# Patient Record
Sex: Male | Born: 1960 | Race: Black or African American | Hispanic: No | Marital: Single | Smoking: Current some day smoker
Health system: Southern US, Community
[De-identification: ages and names within clinical notes are randomized; demographics above are authoritative.]

## PROBLEM LIST (undated history)

## (undated) DIAGNOSIS — D869 Sarcoidosis, unspecified: Secondary | ICD-10-CM

## (undated) DIAGNOSIS — J45909 Unspecified asthma, uncomplicated: Secondary | ICD-10-CM

## (undated) DIAGNOSIS — J449 Chronic obstructive pulmonary disease, unspecified: Secondary | ICD-10-CM

---

## 2015-09-15 ENCOUNTER — Inpatient Hospital Stay
Admission: EM | Admit: 2015-09-15 | Discharge: 2015-09-17 | DRG: 871 | Disposition: A | Discharge status: Home / Self Care | Payer: Medicare Other | Attending: Internal Medicine | Admitting: Internal Medicine

## 2015-09-15 ENCOUNTER — Inpatient Hospital Stay: Payer: Medicare Other

## 2015-09-15 ENCOUNTER — Encounter: Payer: Self-pay | Admitting: Emergency Medicine

## 2015-09-15 ENCOUNTER — Emergency Department: Payer: Medicare Other

## 2015-09-15 DIAGNOSIS — J9600 Acute respiratory failure, unspecified whether with hypoxia or hypercapnia: Secondary | ICD-10-CM

## 2015-09-15 DIAGNOSIS — J45909 Unspecified asthma, uncomplicated: Secondary | ICD-10-CM | POA: Diagnosis present

## 2015-09-15 DIAGNOSIS — A419 Sepsis, unspecified organism: Secondary | ICD-10-CM | POA: Diagnosis not present

## 2015-09-15 DIAGNOSIS — J440 Chronic obstructive pulmonary disease with acute lower respiratory infection: Secondary | ICD-10-CM | POA: Diagnosis present

## 2015-09-15 DIAGNOSIS — F17210 Nicotine dependence, cigarettes, uncomplicated: Secondary | ICD-10-CM | POA: Diagnosis present

## 2015-09-15 DIAGNOSIS — J9601 Acute respiratory failure with hypoxia: Secondary | ICD-10-CM | POA: Diagnosis present

## 2015-09-15 DIAGNOSIS — D869 Sarcoidosis, unspecified: Secondary | ICD-10-CM | POA: Diagnosis present

## 2015-09-15 DIAGNOSIS — J9801 Acute bronchospasm: Secondary | ICD-10-CM | POA: Diagnosis not present

## 2015-09-15 DIAGNOSIS — Z716 Tobacco abuse counseling: Secondary | ICD-10-CM

## 2015-09-15 DIAGNOSIS — J189 Pneumonia, unspecified organism: Secondary | ICD-10-CM | POA: Diagnosis present

## 2015-09-15 DIAGNOSIS — J441 Chronic obstructive pulmonary disease with (acute) exacerbation: Secondary | ICD-10-CM

## 2015-09-15 DIAGNOSIS — Z8249 Family history of ischemic heart disease and other diseases of the circulatory system: Secondary | ICD-10-CM

## 2015-09-15 HISTORY — DX: Sarcoidosis, unspecified: D86.9

## 2015-09-15 HISTORY — DX: Unspecified asthma, uncomplicated: J45.909

## 2015-09-15 HISTORY — DX: Chronic obstructive pulmonary disease, unspecified: J44.9

## 2015-09-15 LAB — URINALYSIS COMPLETE WITH MICROSCOPIC (ARMC ONLY)
Bacteria, UA: NONE SEEN
Bilirubin Urine: NEGATIVE
GLUCOSE, UA: NEGATIVE mg/dL
HGB URINE DIPSTICK: NEGATIVE
Ketones, ur: NEGATIVE mg/dL
LEUKOCYTES UA: NEGATIVE
NITRITE: NEGATIVE
PH: 5 (ref 5.0–8.0)
Protein, ur: NEGATIVE mg/dL
RBC / HPF: NONE SEEN RBC/hpf (ref 0–5)
SPECIFIC GRAVITY, URINE: 1.005 (ref 1.005–1.030)
Squamous Epithelial / LPF: NONE SEEN
WBC UA: NONE SEEN WBC/hpf (ref 0–5)

## 2015-09-15 LAB — COMPREHENSIVE METABOLIC PANEL
ALBUMIN: 4.1 g/dL (ref 3.5–5.0)
ALT: 17 U/L (ref 17–63)
ANION GAP: 9 (ref 5–15)
AST: 31 U/L (ref 15–41)
Alkaline Phosphatase: 88 U/L (ref 38–126)
BUN: 10 mg/dL (ref 6–20)
CHLORIDE: 99 mmol/L — AB (ref 101–111)
CO2: 27 mmol/L (ref 22–32)
Calcium: 9 mg/dL (ref 8.9–10.3)
Creatinine, Ser: 1.2 mg/dL (ref 0.61–1.24)
Glucose, Bld: 109 mg/dL — ABNORMAL HIGH (ref 65–99)
POTASSIUM: 3.9 mmol/L (ref 3.5–5.1)
SODIUM: 135 mmol/L (ref 135–145)
Total Bilirubin: 0.3 mg/dL (ref 0.3–1.2)
Total Protein: 7.5 g/dL (ref 6.5–8.1)

## 2015-09-15 LAB — CBC WITH DIFFERENTIAL/PLATELET
BASOS ABS: 0.1 10*3/uL (ref 0–0.1)
Basophils Relative: 1 %
EOS PCT: 1 %
Eosinophils Absolute: 0.1 10*3/uL (ref 0–0.7)
HEMATOCRIT: 44.5 % (ref 40.0–52.0)
HEMOGLOBIN: 14.7 g/dL (ref 13.0–18.0)
LYMPHS ABS: 0.9 10*3/uL — AB (ref 1.0–3.6)
LYMPHS PCT: 8 %
MCH: 28.1 pg (ref 26.0–34.0)
MCHC: 32.9 g/dL (ref 32.0–36.0)
MCV: 85.4 fL (ref 80.0–100.0)
Monocytes Absolute: 1.2 10*3/uL — ABNORMAL HIGH (ref 0.2–1.0)
Monocytes Relative: 11 %
NEUTROS ABS: 8.5 10*3/uL — AB (ref 1.4–6.5)
NEUTROS PCT: 79 %
PLATELETS: 303 10*3/uL (ref 150–440)
RBC: 5.22 MIL/uL (ref 4.40–5.90)
RDW: 14.3 % (ref 11.5–14.5)
WBC: 10.7 10*3/uL — AB (ref 3.8–10.6)

## 2015-09-15 LAB — STREP PNEUMONIAE URINARY ANTIGEN: STREP PNEUMO URINARY ANTIGEN: NEGATIVE

## 2015-09-15 LAB — GLUCOSE, CAPILLARY
GLUCOSE-CAPILLARY: 127 mg/dL — AB (ref 65–99)
GLUCOSE-CAPILLARY: 149 mg/dL — AB (ref 65–99)
Glucose-Capillary: 145 mg/dL — ABNORMAL HIGH (ref 65–99)

## 2015-09-15 LAB — TROPONIN I: TROPONIN I: 0.03 ng/mL (ref <0.031)

## 2015-09-15 LAB — LACTIC ACID, PLASMA
Lactic Acid, Venous: 1.6 mmol/L (ref 0.5–2.0)
Lactic Acid, Venous: 2.1 mmol/L (ref 0.5–2.0)

## 2015-09-15 LAB — RAPID INFLUENZA A&B ANTIGENS: Influenza B (ARMC): NEGATIVE

## 2015-09-15 LAB — MRSA PCR SCREENING: MRSA by PCR: NEGATIVE

## 2015-09-15 LAB — PROTIME-INR
INR: 1.11
Prothrombin Time: 14.5 seconds (ref 11.4–15.0)

## 2015-09-15 LAB — RAPID INFLUENZA A&B ANTIGENS (ARMC ONLY): INFLUENZA A (ARMC): NEGATIVE

## 2015-09-15 MED ORDER — CEFTRIAXONE SODIUM 1 G IJ SOLR
1.0000 g | INTRAMUSCULAR | Status: DC
  Filled 2015-09-15: qty 10

## 2015-09-15 MED ORDER — PREDNISONE 50 MG PO TABS
50.0000 mg | ORAL_TABLET | Freq: Every day | ORAL | Status: DC
  Administered 2015-09-16 – 2015-09-17 (×2): 50 mg via ORAL
  Filled 2015-09-15 (×2): qty 1

## 2015-09-15 MED ORDER — HYDROCODONE-ACETAMINOPHEN 5-325 MG PO TABS
1.0000 | ORAL_TABLET | ORAL | Status: DC | PRN
  Administered 2015-09-16: 1 via ORAL
  Administered 2015-09-16: 2 via ORAL
  Administered 2015-09-16: 1 via ORAL
  Administered 2015-09-17 (×2): 2 via ORAL
  Filled 2015-09-15: qty 1
  Filled 2015-09-15: qty 2
  Filled 2015-09-15: qty 1
  Filled 2015-09-15 (×2): qty 2

## 2015-09-15 MED ORDER — ALBUTEROL SULFATE (2.5 MG/3ML) 0.083% IN NEBU
5.0000 mg | INHALATION_SOLUTION | RESPIRATORY_TRACT | Status: AC
  Administered 2015-09-15: 5 mg via RESPIRATORY_TRACT
  Filled 2015-09-15: qty 6

## 2015-09-15 MED ORDER — GUAIFENESIN 100 MG/5ML PO SOLN
10.0000 mL | ORAL | Status: DC | PRN
  Administered 2015-09-15 – 2015-09-17 (×7): 200 mg via ORAL
  Filled 2015-09-15 (×7): qty 10

## 2015-09-15 MED ORDER — ACETAMINOPHEN 500 MG PO TABS
1000.0000 mg | ORAL_TABLET | ORAL | Status: AC
  Administered 2015-09-15: 1000 mg via ORAL
  Filled 2015-09-15: qty 2

## 2015-09-15 MED ORDER — IPRATROPIUM-ALBUTEROL 0.5-2.5 (3) MG/3ML IN SOLN
3.0000 mL | Freq: Once | RESPIRATORY_TRACT | Status: AC
  Administered 2015-09-15: 3 mL via RESPIRATORY_TRACT
  Filled 2015-09-15: qty 3

## 2015-09-15 MED ORDER — VANCOMYCIN HCL IN DEXTROSE 1-5 GM/200ML-% IV SOLN
1000.0000 mg | Freq: Once | INTRAVENOUS | Status: AC
  Administered 2015-09-15: 1000 mg via INTRAVENOUS
  Filled 2015-09-15: qty 200

## 2015-09-15 MED ORDER — PIPERACILLIN-TAZOBACTAM 3.375 G IVPB 30 MIN
3.3750 g | Freq: Once | INTRAVENOUS | Status: AC
  Administered 2015-09-15: 3.375 g via INTRAVENOUS
  Filled 2015-09-15: qty 50

## 2015-09-15 MED ORDER — INSULIN ASPART 100 UNIT/ML ~~LOC~~ SOLN
0.0000 [IU] | SUBCUTANEOUS | Status: DC
  Administered 2015-09-15 – 2015-09-16 (×4): 1 [IU] via SUBCUTANEOUS
  Filled 2015-09-15 (×4): qty 1

## 2015-09-15 MED ORDER — ACETAMINOPHEN 650 MG RE SUPP: 650.0000 mg | Freq: Four times a day (QID) | RECTAL | Status: DC | PRN

## 2015-09-15 MED ORDER — DEXTROSE 5 % IV SOLN
500.0000 mg | INTRAVENOUS | Status: DC
  Administered 2015-09-15: 500 mg via INTRAVENOUS
  Filled 2015-09-15 (×2): qty 500

## 2015-09-15 MED ORDER — MORPHINE SULFATE (PF) 2 MG/ML IV SOLN
1.0000 mg | INTRAVENOUS | Status: DC | PRN
  Administered 2015-09-15 – 2015-09-16 (×5): 1 mg via INTRAVENOUS
  Filled 2015-09-15 (×5): qty 1

## 2015-09-15 MED ORDER — SODIUM CHLORIDE 0.9 % IV SOLN
INTRAVENOUS | Status: DC
  Administered 2015-09-15 – 2015-09-16 (×3): via INTRAVENOUS

## 2015-09-15 MED ORDER — ENOXAPARIN SODIUM 40 MG/0.4ML ~~LOC~~ SOLN
40.0000 mg | SUBCUTANEOUS | Status: DC
  Administered 2015-09-15 – 2015-09-16 (×2): 40 mg via SUBCUTANEOUS
  Filled 2015-09-15 (×2): qty 0.4

## 2015-09-15 MED ORDER — ACETAMINOPHEN 325 MG PO TABS
650.0000 mg | ORAL_TABLET | Freq: Four times a day (QID) | ORAL | Status: DC | PRN
  Administered 2015-09-15 – 2015-09-16 (×2): 650 mg via ORAL
  Filled 2015-09-15 (×2): qty 2
  Filled 2015-09-15: qty 1

## 2015-09-15 MED ORDER — ALBUTEROL SULFATE (2.5 MG/3ML) 0.083% IN NEBU
2.5000 mg | INHALATION_SOLUTION | Freq: Four times a day (QID) | RESPIRATORY_TRACT | Status: DC
  Administered 2015-09-15 – 2015-09-17 (×7): 2.5 mg via RESPIRATORY_TRACT
  Filled 2015-09-15 (×9): qty 3

## 2015-09-15 MED ORDER — SENNOSIDES-DOCUSATE SODIUM 8.6-50 MG PO TABS: 1.0000 | ORAL_TABLET | Freq: Every evening | ORAL | Status: DC | PRN

## 2015-09-15 MED ORDER — METHYLPREDNISOLONE SODIUM SUCC 125 MG IJ SOLR
125.0000 mg | INTRAMUSCULAR | Status: AC
  Administered 2015-09-15: 125 mg via INTRAVENOUS
  Filled 2015-09-15: qty 2

## 2015-09-15 MED ORDER — CETYLPYRIDINIUM CHLORIDE 0.05 % MT LIQD
7.0000 mL | Freq: Two times a day (BID) | OROMUCOSAL | Status: DC
  Administered 2015-09-15 – 2015-09-16 (×2): 7 mL via OROMUCOSAL

## 2015-09-15 MED ORDER — MORPHINE SULFATE (PF) 4 MG/ML IV SOLN
4.0000 mg | Freq: Once | INTRAVENOUS | Status: AC
  Administered 2015-09-15: 4 mg via INTRAVENOUS
  Filled 2015-09-15: qty 1

## 2015-09-15 MED ORDER — SODIUM CHLORIDE 0.9 % IV BOLUS (SEPSIS)
1973.0000 mL | Freq: Once | INTRAVENOUS | Status: AC
  Administered 2015-09-15: 1973 mL via INTRAVENOUS

## 2015-09-15 MED ORDER — TIOTROPIUM BROMIDE MONOHYDRATE 18 MCG IN CAPS
18.0000 ug | ORAL_CAPSULE | Freq: Every day | RESPIRATORY_TRACT | Status: DC
  Administered 2015-09-15: 18 ug via RESPIRATORY_TRACT
  Filled 2015-09-15 (×2): qty 5

## 2015-09-15 NOTE — ED Notes (Signed)
Pt presents to ED with c/o difficulty breathing, pain in his chest, and a fever. Pt states he has a hx of sarcoidosis, COPD, and asthma. Pt taken back to room and triaged in room.

## 2015-09-15 NOTE — Progress Notes (Signed)
RN spoke with Dr. Allena Katz about patient traveling to CT without RN. Patient is A&Ox4, denies pain and VSS.  MD gave order for patient to travel to CT scan without RN.

## 2015-09-15 NOTE — ED Provider Notes (Signed)
[facility] Emergency Department Provider Note  ____________________________________________  Time seen: Approximately 1:21 PM  I have reviewed the triage vital signs and the nursing notes.   HISTORY  Chief Complaint Code Sepsis    HPI Miguel Nunez is a 55 y.o. male who presents for evaluation of chest tightness fever and cough.  Patient reports that since last night is been experiencing difficulty breathing, wheezing, using nebulizers at home with no relief. He's also had a productive cough and feeling feverish. He feels tight across the entire chest. He does have a history of sarcoidosis.  He denies nausea or vomiting. No headache. Muscle aches. No runny nose.  Currently states moderate shortness of breath.  Otherwise reports only concern is arthritis she is required after many years, and chronic.   Past Medical History  Diagnosis Date  . Sarcoidosis (HCC)   . COPD (chronic obstructive pulmonary disease) (HCC)   . Asthma     Patient Active Problem List   Diagnosis Date Noted  . Sepsis (HCC) 09/15/2015    History reviewed. No pertinent past surgical history.  No current outpatient prescriptions on file.  Allergies Review of patient's allergies indicates no known allergies.  Family History  Problem Relation Age of Onset  . Hypertension Other     Social History Social History  Substance Use Topics  . Smoking status: Current Some Day Smoker  . Smokeless tobacco: Never Used  . Alcohol Use: Yes     Comment: Occassionally    Review of Systems Constitutional: See history of present illness Eyes: No visual changes. ENT: No sore throat. Cardiovascular: Feels tight for about the last 12 hours Respiratory: See history of present illness Gastrointestinal: No abdominal pain.  No nausea, no vomiting.  No diarrhea.  No constipation. Genitourinary: Negative for dysuria. Musculoskeletal: Negative for back pain. Skin: Negative for  rash. Neurological: Negative for headaches, focal weakness or numbness.  10-point ROS otherwise negative.  ____________________________________________   PHYSICAL EXAM:  VITAL SIGNS: ED Triage Vitals  Enc Vitals Group     BP 09/15/15 1153 128/67 mmHg     Pulse Rate 09/15/15 1153 150     Resp 09/15/15 1153 24     Temp 09/15/15 1153 103.2 F (39.6 C)     Temp Source 09/15/15 1153 Oral     SpO2 09/15/15 1153 92 %     Weight 09/15/15 1153 145 lb (65.772 kg)     Height 09/15/15 1153  (1.727 m)     Head Cir --      Peak Flow --      Pain Score 09/15/15 1153 8     Pain Loc --      Pain Edu? --      Excl. in GC? --    Constitutional: Alert and oriented. Moderately ill-appearing, mildly tachypnea. He is amicable and pleasant, however does appear to have mild to moderate increased work of breathing. Eyes: Conjunctivae are normal. PERRL. EOMI. Head: Atraumatic. Nose: No congestion/rhinnorhea. Mouth/Throat: Mucous membranes are quite dry.  Oropharynx non-erythematous. Neck: No stridor.  No meningismus Cardiovascular: Tachycardic rate, regular rhythm. Grossly normal heart sounds.  Good peripheral circulation. Respiratory: Prolonged expiratory phase, speaks in phrases. Mild end expiratory wheezing heard throughout. Gastrointestinal: Soft and nontender. No distention. No abdominal bruits. No CVA tenderness. Musculoskeletal: No lower extremity tenderness nor edema.  No joint effusions. Neurologic:  Normal speech and language. No gross focal neurologic deficits are appreciated.Skin:  Skin is warm, dry and intact. No rash noted.  Psychiatric: Mood and affect are normal. Speech and behavior are normal.  ____________________________________________   LABS (all labs ordered are listed, but only abnormal results are displayed)  Labs Reviewed  LACTIC ACID, PLASMA - Abnormal; Notable for the following:    Lactic Acid, Venous 2.1 (*)    All other components within normal limits   COMPREHENSIVE METABOLIC PANEL - Abnormal; Notable for the following:    Chloride 99 (*)    Glucose, Bld 109 (*)    All other components within normal limits  CBC WITH DIFFERENTIAL/PLATELET - Abnormal; Notable for the following:    WBC 10.7 (*)    Neutro Abs 8.5 (*)    Lymphs Abs 0.9 (*)    Monocytes Absolute 1.2 (*)    All other components within normal limits  URINALYSIS COMPLETEWITH MICROSCOPIC ([facility] ONLY) - Abnormal; Notable for the following:    Color, Urine COLORLESS (*)    APPearance CLEAR (*)    All other components within normal limits  GLUCOSE, CAPILLARY - Abnormal; Notable for the following:    Glucose-Capillary 127 (*)    All other components within normal limits  RAPID INFLUENZA A&B ANTIGENS ([facility] ONLY)  CULTURE, BLOOD (ROUTINE X 2)  CULTURE, BLOOD (ROUTINE X 2)  URINE CULTURE  MRSA PCR SCREENING  LACTIC ACID, PLASMA  TROPONIN I  PROTIME-INR  STREP PNEUMONIAE URINARY ANTIGEN  MYCOPLASMA PNEUMONIAE ANTIBODY, IGM   ____________________________________________  EKG  Reviewed into room 1145 Sinus tachycardia at a rate of 150 PR 140 QTc 400 Possible right ventricular hypertrophy. P waves are noted corresponding to each ventricular complex. No evidence of acute ST elevation ____________________________________________  RADIOLOGY  DG Chest Port 1 View (Final result) Result time: 09/15/15 12:51:01   Final result by Rad Results In Interface (09/15/15 12:51:01)   Narrative:   CLINICAL DATA: Fever  EXAM: PORTABLE CHEST 1 VIEW  COMPARISON: None.  FINDINGS: Patchy opacities are seen throughout the right lung. Left lung is clear. Normal heart size. Tortuous aorta.  IMPRESSION: Patchy opacities are seen throughout the right lung worrisome for bronchopneumonia. Followup PA and lateral chest X-ray is recommended in 3-4 weeks following trial of antibiotic therapy to ensure resolution and exclude underlying malignancy.   Electronically Signed By:  Jolaine Click M.D. On: 09/15/2015 12:51    ____________________________________________   PROCEDURES  Procedure(s) performed: None  Critical Care performed: Yes, see critical care note(s)  CRITICAL CARE Performed by: Sharyn Creamer   Total critical care time: 35 minutes  Critical care time was exclusive of separately billable procedures and treating other patients.  Critical care was necessary to treat or prevent imminent or life-threatening deterioration.  Critical care was time spent personally by me on the following activities: development of treatment plan with patient and/or surrogate as well as nursing, discussions with consultants, evaluation of patient's response to treatment, examination of patient, obtaining history from patient or surrogate, ordering and performing treatments and interventions, ordering and review of laboratory studies, ordering and review of radiographic studies, pulse oximetry and re-evaluation of patient's condition.  ____________________________________________   INITIAL IMPRESSION / ASSESSMENT AND PLAN / ED COURSE  Pertinent labs & imaging results that were available during my care of the patient were reviewed by me and considered in my medical decision making (see chart for details).  She rinses fever, tachypnea, tachycardia. Also likely an element of reactive airway disease/sarcoidosis. Notably afebrile, x-ray demonstrates multifocal pneumonia. Initiate antibiotics, "code sepsis pathway". EKG, troponin do not indicate clear evidence of acute coronary syndrome. Admit to  the hospitalist for ongoing care and evaluation. Patient does report improvement after nebulizers and fluids, however remains tachycardic and slightly tachypneic but overall does appear to be improving.  Based on tachypnea, increased work of breathing I discussed with the patient and as the treating physician did not feel the patient is hemodynamically stable [facility]. Patient and wife are agreeable with this, we will admit the patient here for safety as appropriate level of care is available.   FINAL CLINICAL IMPRESSION(S) / ED DIAGNOSES  Final diagnoses:  Pneumonia   sepsis    Sharyn Creamer, MD 09/15/15 [id number]

## 2015-09-15 NOTE — H&P (Signed)
[facility] - [place] at [facility]   PATIENT NAME: Miguel Nunez    MR#:  [id number]  DATE OF BIRTH:  [birth year]  DATE OF ADMISSION:  09/15/2015  [facility]   REQUESTING/REFERRING PHYSICIAN: Dr. Fanny Bien  CHIEF COMPLAINT:  Shortness of breath cough and fever for 2-3 days  HISTORY OF PRESENT ILLNESS:  Miguel Nunez  is a 55 y.o. male with a known history of sarcoidosis, asthma/COPD with ongoing tobacco abuse comes to the emergency room with increasing shortness of breath chest tightness and cough which is dry. 3 days. Patient was in contact with his nephew who was sick with cold and cough. Came in with fever of 103 tachycardia with heart rate in the 140s an elevated white count of 10,000. Patient's chest x-ray shows multifocal pneumonia bilateral. He received IV vancomycin and Zosyn in the emergency room. Patient is being admitted with sepsis secondary to multifocal bilateral pneumonia.  Patient reports he has been off his maintenance steroids since November 2016. His medication includes only oral inhalers.  PAST MEDICAL HISTORY:   Past Medical History  Diagnosis Date  . Sarcoidosis (HCC)   . COPD (chronic obstructive pulmonary disease) (HCC)   . Asthma     PAST SURGICAL HISTOIRY:  History reviewed. No pertinent past surgical history.  SOCIAL HISTORY:   Social History  Substance Use Topics  . Smoking status: Current Some Day Smoker  . Smokeless tobacco: Never Used  . Alcohol Use: Yes     Comment: Occassionally    FAMILY HISTORY:   Family History  Problem Relation Age of Onset  . Hypertension Other     DRUG ALLERGIES:  No Known Allergies  REVIEW OF SYSTEMS:  Review of Systems  Constitutional: Positive for fever. Negative for chills and weight loss.  HENT: Negative for ear discharge, ear pain and nosebleeds.   Eyes: Negative for blurred vision, pain and discharge.  Respiratory: Positive for cough and shortness of breath.  Negative for sputum production, wheezing and stridor.   Cardiovascular: Positive for palpitations. Negative for chest pain, orthopnea and PND.  Gastrointestinal: Negative for nausea, vomiting, abdominal pain and diarrhea.  Genitourinary: Negative for urgency and frequency.  Musculoskeletal: Negative for back pain and joint pain.  Neurological: Positive for weakness. Negative for sensory change, speech change and focal weakness.  Psychiatric/Behavioral: Negative for depression and hallucinations. The patient is not nervous/anxious.   All other systems reviewed and are negative.    MEDICATIONS AT HOME:   Prior to Admission medications   Not on File      VITAL SIGNS:  Blood pressure 119/56, pulse 135, temperature 103.2 F (39.6 C), temperature source Oral, resp. rate 25, height  (1.727 m), weight 65.772 kg (145 lb), SpO2 95 %.  PHYSICAL EXAMINATION:  GENERAL:  55 y.o.-year-old patient lying in the bed with  acute distress.  EYES: Pupils equal, round, reactive to light and accommodation. No scleral icterus. Extraocular muscles intact.  HEENT: Head atraumatic, normocephalic. Oropharynx and nasopharynx clear.  NECK:  Supple, no jugular venous distention. No thyroid enlargement, no tenderness.  LUNGS: Distant breath sounds bilaterally, no wheezing, rales,rhonchi or crepitation. No use of accessory muscles of respiration.  CARDIOVASCULAR: S1, S2 normal. No murmurs, rubs, or gallops. Tachycardia ABDOMEN: Soft, nontender, nondistended. Bowel sounds present. No organomegaly or mass.  EXTREMITIES: No pedal edema, cyanosis, or clubbing.  NEUROLOGIC: Cranial nerves II through XII are intact. Muscle strength 5/5 in all extremities. Sensation intact. Gait not checked.  PSYCHIATRIC: The  patient is alert and oriented x 3.  SKIN: No obvious rash, lesion, or ulcer.   LABORATORY PANEL:   CBC  Recent Labs Lab 09/15/15 1200  WBC 10.7*  HGB 14.7  HCT 44.5  PLT 303    ------------------------------------------------------------------------------------------------------------------  Chemistries   Recent Labs Lab 09/15/15 1200  NA 135  K 3.9  CL 99*  CO2 27  GLUCOSE 109*  BUN 10  CREATININE 1.20  CALCIUM 9.0  AST 31  ALT 17  ALKPHOS 88  BILITOT 0.3   ------------------------------------------------------------------------------------------------------------------  Cardiac Enzymes  Recent Labs Lab 09/15/15 1200  TROPONINI 0.03   ------------------------------------------------------------------------------------------------------------------  RADIOLOGY:  Dg Chest Port 1 View  09/15/2015  CLINICAL DATA:  Fever EXAM: PORTABLE CHEST 1 VIEW COMPARISON:  None. FINDINGS: Patchy opacities are seen throughout the right lung. Left lung is clear. Normal heart size. Tortuous aorta. IMPRESSION: Patchy opacities are seen throughout the right lung worrisome for bronchopneumonia. Followup PA and lateral chest X-ray is recommended in 3-4 weeks following trial of antibiotic therapy to ensure resolution and exclude underlying malignancy. Electronically Signed   By: Jolaine Click M.D.   On: 09/15/2015 12:51    EKG:   Sinus tachycardia and right ventricular hypertrophy IMPRESSION AND PLAN:  Miguel Nunez  is a 55 y.o. male with a known history of sarcoidosis, asthma/COPD with ongoing tobacco abuse comes to the emergency room with increasing shortness of breath chest tightness and cough which is dry. 3 days. Patient was in contact with his nephew who was sick with cold and cough. Came in with fever of 103 tachycardia with heart rate in the 140s an elevated white count of 10,000. Patient's chest x-ray shows multifocal pneumonia   1. Sepsis secondary to multifocal pneumonia bilateral, community-acquired Admit to ICU IV Rocephin and Zithromax Follow blood cultures sputum culture if patient able to give sample Pulmonary consultation Aggressive IV  hydration Oral inhalers, when necessary nebs, Spiriva   2. Bilateral pneumonia Treatment as above   3. History of COPD/asthma with ongoing tobacco abuse Patient is not wheezing at present his sats are stable on 2 L nasal canal oxygen Received a dose of IV Solu-Medrol in the ER  I'll hold off on further dosing of Solu-Medrol unless patient starts wheezing will order some  4. History of sarcoidosis Patient has been off maintenance steroids since November 2016 Hold off on stress doses of steroid  5. DVT prophylaxis subcutaneous Lovenox  6. Tobacco abuse smoking cessation advised 3 minutes spent. Patient voiced understanding  Bubble was discussed with patient and patient's wife present emergency room   All the records are reviewed and case discussed with ED provider. Management plans discussed with the patient, family and they are in agreement.  CODE STATUS: Full   TOTAL critical  TIME TAKING CARE OF THIS PATIENT:50 mins.    Shadae Reino M.D on 09/15/2015 at 1:42 PM  Between 7am to 6pm - Pager - [phone]  After 6pm go to [email or web address] - password [email or web address]  [facility]  Office  [phone]  CC: Primary care physician; Pcp Not In System

## 2015-09-15 NOTE — Progress Notes (Signed)
eLink Physician-Brief Progress Note Patient Name: Miguel Nunez DOB: 31-Oct-1960 MRN: [id number]   Date of Service  09/15/2015  HPI/Events of Note  55 yo male with PMH of sarcoidosis, asthma, COPD and tobacco abuse. Presents with SOB >> CXR reveals multifocal pneumonia. Current medical regimen includes Albuterol Nebs, Azithromycin, Rocephin, Lovenox [place], Spivira Capsule, Tylenol Supp/PO, Norco PRN, Morphine PRN and Senokot-S PRN. Management per Hospitalist Service. Temp = 98.6, BP = 115/61, HR = 132, Sat = 94% and RR = 22.  eICU Interventions  Continue present management.      Intervention Category Evaluation Type: New Patient Evaluation  Lenell Antu 09/15/2015, 3:39 PM

## 2015-09-15 NOTE — Progress Notes (Signed)
Dr. Allena Katz paged to make her aware of Lactic acid of 2.1 and to ask if she wants an EKG since there is ST elevation on rhythm strip. Patient denies chest pain.

## 2015-09-15 NOTE — Consult Note (Signed)
[facility] [place] [facility] Medicine Consultation     ASSESSMENT/PLAN   The patient is a 55 year old male with a history of sarcoidosis, COPD, active smoking, now presents with acute respiratory failure due to pneumonia, COPD exacerbation.   PULMONARY  A: Pneumonia with sepsis. Acute exacerbation of COPD. Acute respiratory failure secondary to above. Sarcoidosis.  P:   -Continue Zosyn, azithromycin. We will discontinue ceftriaxone. -We'll start prednisone for COPD exacerbation. -Continue DuoNeb's. -Given severity of pneumonia. Will check a CT of the chest without contrast to look for evidence of lung mass. -Mycoplasma IgM  CARDIOVASCULAR  A:  Sinus tachycardia, likely due to pulmonary disease. P:  -We'll continue to monitor, will consider treatment if needed. -Enoxaparin for DVT prophylaxis.  RENAL A:  Acute kidney injury, secondary to sepsis. P:   Continue IV fluid.   INFECTIOUS A: Pneumonia with sepsis, febrile. P:   Antibiotics as below. Continue IV fluids.  Influenza a and b negative. Blood culture 2/19>> pending Urine culture 2/19>> pending Mycoplasma IgM ordered 2/19 >>  Zosyn 2/19>> Azithromycin 2/19>> Ceftriaxone 2/19>> 2/19.  ENDOCRINE A: Sliding scale insulin and blood glucose monitoring, as the patient has been started on steroids.  GASTROINTESTINAL A:  Advance diet as tolerated.  HEMATOLOGIC A:   P:      NEUROLOGIC A:  --   ---------------------------------------  ---------------------------------------   Name: Miguel Nunez MRN: [id number] DOB: 11/10/1960    ADMISSION DATE:  09/15/2015 CONSULTATION DATE:  09/15/15.   REFERRING MD :  Dr. Allena Katz.    CHIEF COMPLAINT:  Dyspnea.    HISTORY OF PRESENT ILLNESS:    Patient is a 55 year old male with a history of sarcoidosis, COPD.  At the time of admission, his creatinine is 1.2, baseline is unknown. Venous lactic acid is 1.6. CBC shows a white blood cell count 10.7.  Urinalysis does not show signs of infection. Reviewed the EKG tracings shows sinus tachycardia with a heart rate of 150.Patient tells me that his symptoms started about 2 days prior to admission, he tells me he has no other known medical problems other than sarcoidosis and COPD. He is treated at the [facility] in [place], he comes to [place] often as his fiance lives here. He does not have much in the way of baseline dyspnea, he tells me he was on steroids until a flash in his 6 months ago when he was taken off. Said he had been on steroids off and on up until that time. He smokes about 6 cigarettes a day, he currently uses Flovent 2 puffs once daily, he has when necessary nebulizers that he has not required. He has been on Advair and Spiriva in the past. He is not on them currently, though he is not sure about why they were changed.  Negative. Urine cultures, blood cultures are pending. Review of chest x-ray images shows hyperinflation, mild mediastinal and/or right hilar lymphadenopathy, right mid zone infiltrate consistent with pneumonia laterally.   PAST MEDICAL HISTORY :  Past Medical History  Diagnosis Date  . Sarcoidosis (HCC)   . COPD (chronic obstructive pulmonary disease) (HCC)   . Asthma    History reviewed. No pertinent past surgical history. Prior to Admission medications   Medication Sig Start Date End Date Taking? Authorizing Provider  albuterol (PROVENTIL HFA;VENTOLIN HFA) 108 (90 Base) MCG/ACT inhaler Inhale 2 puffs into the lungs every 6 (six) hours as needed for wheezing or shortness of breath.   Yes Historical Provider, MD  albuterol (PROVENTIL) (2.5 MG/3ML) 0.083%  nebulizer solution Take 2.5 mg by nebulization every 6 (six) hours as needed for wheezing or shortness of breath.   Yes Historical Provider, MD  Ipratropium-Albuterol (COMBIVENT) 20-100 MCG/ACT AERS respimat Inhale 1 puff into the lungs 4 (four) times daily.   Yes Historical Provider, MD  Tiotropium  Bromide-Olodaterol (STIOLTO RESPIMAT) 2.5-2.5 MCG/ACT AERS Inhale 1 puff into the lungs daily.   Yes Historical Provider, MD   No Known Allergies  FAMILY HISTORY:  Family History  Problem Relation Age of Onset  . Hypertension Other    SOCIAL HISTORY:  reports that he has been smoking.  He has never used smokeless tobacco. He reports that he drinks alcohol. He reports that he does not use illicit drugs.  REVIEW OF SYSTEMS:   Constitutional: Feels well. Cardiovascular: No chest pain.  Pulmonary: Denies dyspnea.   The remainder of systems were reviewed and were found to be negative other than what is documented in the HPI.    VITAL SIGNS: Temp:  [98.6 F (37 C)-103.2 F (39.6 C)] 98.6 F (37 C) (02/19 1506) Pulse Rate:  [130-154] 132 (02/19 1430) Resp:  [20-33] 22 (02/19 1506) BP: (98-136)/(56-77) 115/61 mmHg (02/19 1430) SpO2:  [92 %-100 %] 94 % (02/19 1430) Weight:  [145 lb (65.772 kg)-146 lb 2.6 oz (66.3 kg)] 146 lb 2.6 oz (66.3 kg) (02/19 1506) HEMODYNAMICS:   VENTILATOR SETTINGS:   INTAKE / OUTPUT:  Intake/Output Summary (Last 24 hours) at 09/15/15 1511 Last data filed at 09/15/15 1444  Gross per 24 hour  Intake      0 ml  Output   1000 ml  Net  -1000 ml    Physical Examination:   VS: BP 115/61 mmHg  Pulse 132  Temp(Src) 98.6 F (37 C) (Oral)  Resp 22  Ht 5\' 8"  (1.727 m)  Wt 146 lb 2.6 oz (66.3 kg)  BMI 22.23 kg/m2  SpO2 94%  General Appearance: No distress  patient is warm to the touch.  Neuro:without focal findings, mental status HEENT: PERRLA, EOM intact, no ptosis, no other lesions noticed;  Pulmonary: normal breath sounds.,  right-sided rhonchi and wheezes. Transmitted breath sounds heard on the left side.  CardiovascularNormal S1,S2.  No m/r/g.    Abdomen: Benign, Soft, non-tender,  Renal:  No costovertebral tenderness  GU:  Not performed at this time. Endoc: No evident thyromegaly, no signs of acromegaly. Skin:   warm, no rashes, no ecchymosis    Extremities: normal, no cyanosis, clubbing, no edema, warm with reduced refill.    LABS: Reviewed   LABORATORY PANEL:   CBC  Recent Labs Lab 09/15/15 1200  WBC 10.7*  HGB 14.7  HCT 44.5  PLT 303    Chemistries   Recent Labs Lab 09/15/15 1200  NA 135  K 3.9  CL 99*  CO2 27  GLUCOSE 109*  BUN 10  CREATININE 1.20  CALCIUM 9.0  AST 31  ALT 17  ALKPHOS 88  BILITOT 0.3    No results for input(s): GLUCAP in the last 168 hours. No results for input(s): PHART, PCO2ART, PO2ART in the last 168 hours.  Recent Labs Lab 09/15/15 1200  AST 31  ALT 17  ALKPHOS 88  BILITOT 0.3  ALBUMIN 4.1    Cardiac Enzymes  Recent Labs Lab 09/15/15 1200  TROPONINI 0.03    RADIOLOGY:  Dg Chest Port 1 View  09/15/2015  CLINICAL DATA:  Fever EXAM: PORTABLE CHEST 1 VIEW COMPARISON:  None. FINDINGS: Patchy opacities are seen throughout the right lung.  Left lung is clear. Normal heart size. Tortuous aorta. IMPRESSION: Patchy opacities are seen throughout the right lung worrisome for bronchopneumonia. Followup PA and lateral chest X-ray is recommended in 3-4 weeks following trial of antibiotic therapy to ensure resolution and exclude underlying malignancy. Electronically Signed   By: Jolaine Click M.D.   On: 09/15/2015 12:51       --Wells Guiles, MD.  Board Certified in Internal Medicine, Pulmonary Medicine, Critical Care Medicine, and Sleep Medicine.   [facility]  Santiago Glad, M.D.  Stephanie Acre, M.D.  Billy Fischer, M.D   09/15/2015, 3:11 PM   Critical Care Attestation.  I have personally obtained a history, examined the patient, evaluated laboratory and imaging results, formulated the assessment and plan and placed orders. The Patient requires high complexity decision making for assessment and support, frequent evaluation and titration of therapies, application of advanced monitoring technologies and extensive interpretation of multiple  databases. The patient has critical illness that could lead imminently to failure of 1 or more organ systems and requires the highest level of physician preparedness to intervene.  Critical Care Time devoted to patient care services described in this note is 35 minutes and is exclusive of time spent in procedures.

## 2015-09-15 NOTE — Progress Notes (Signed)
Dr. Allena Katz called back and RN made MD aware of Lactic Acid of 2.1, ST elevation on rhythm strip which patient denies chest pain and RN also made MD aware that patient has dry cough. MD gave order for EKG, Guaifenesin and acknowledged lactic acid.

## 2015-09-16 ENCOUNTER — Inpatient Hospital Stay: Payer: Medicare Other

## 2015-09-16 DIAGNOSIS — D869 Sarcoidosis, unspecified: Secondary | ICD-10-CM

## 2015-09-16 DIAGNOSIS — J9601 Acute respiratory failure with hypoxia: Secondary | ICD-10-CM

## 2015-09-16 DIAGNOSIS — J9801 Acute bronchospasm: Secondary | ICD-10-CM

## 2015-09-16 LAB — BASIC METABOLIC PANEL
ANION GAP: 4 — AB (ref 5–15)
BUN: 12 mg/dL (ref 6–20)
CALCIUM: 8.3 mg/dL — AB (ref 8.9–10.3)
CO2: 24 mmol/L (ref 22–32)
Chloride: 108 mmol/L (ref 101–111)
Creatinine, Ser: 1 mg/dL (ref 0.61–1.24)
GFR calc Af Amer: >60 mL/min (ref >60)
GLUCOSE: 134 mg/dL — AB (ref 65–99)
POTASSIUM: 3.8 mmol/L (ref 3.5–5.1)
SODIUM: 136 mmol/L (ref 135–145)

## 2015-09-16 LAB — CBC
HEMATOCRIT: 37.6 % — AB (ref 40.0–52.0)
HEMOGLOBIN: 12.5 g/dL — AB (ref 13.0–18.0)
MCH: 28.7 pg (ref 26.0–34.0)
MCHC: 33.4 g/dL (ref 32.0–36.0)
MCV: 86 fL (ref 80.0–100.0)
Platelets: 249 10*3/uL (ref 150–440)
RBC: 4.37 MIL/uL — ABNORMAL LOW (ref 4.40–5.90)
RDW: 14.6 % — AB (ref 11.5–14.5)
WBC: 6.9 10*3/uL (ref 3.8–10.6)

## 2015-09-16 LAB — GLUCOSE, CAPILLARY
GLUCOSE-CAPILLARY: 123 mg/dL — AB (ref 65–99)
GLUCOSE-CAPILLARY: 100 mg/dL — AB (ref 65–99)
GLUCOSE-CAPILLARY: 112 mg/dL — AB (ref 65–99)
GLUCOSE-CAPILLARY: 124 mg/dL — AB (ref 65–99)
GLUCOSE-CAPILLARY: 131 mg/dL — AB (ref 65–99)

## 2015-09-16 LAB — C DIFFICILE QUICK SCREEN W PCR REFLEX
C DIFFICILE (CDIFF) INTERP: NEGATIVE
C DIFFICILE (CDIFF) TOXIN: NEGATIVE
C DIFFICLE (CDIFF) ANTIGEN: NEGATIVE

## 2015-09-16 MED ORDER — VANCOMYCIN HCL IN DEXTROSE 1-5 GM/200ML-% IV SOLN
1000.0000 mg | INTRAVENOUS | Status: AC
  Administered 2015-09-16: 1000 mg via INTRAVENOUS
  Filled 2015-09-16: qty 200

## 2015-09-16 MED ORDER — DEXTROSE 5 % IV SOLN
1.0000 g | Freq: Once | INTRAVENOUS | Status: AC
  Administered 2015-09-16: 1 g via INTRAVENOUS
  Filled 2015-09-16: qty 10

## 2015-09-16 MED ORDER — DEXTROSE 5 % IV SOLN
1.0000 g | INTRAVENOUS | Status: DC
  Administered 2015-09-17: 1 g via INTRAVENOUS
  Filled 2015-09-16 (×2): qty 10

## 2015-09-16 MED ORDER — ALBUTEROL SULFATE (2.5 MG/3ML) 0.083% IN NEBU
2.5000 mg | INHALATION_SOLUTION | RESPIRATORY_TRACT | Status: DC | PRN
  Administered 2015-09-16 – 2015-09-17 (×2): 2.5 mg via RESPIRATORY_TRACT
  Filled 2015-09-16: qty 3

## 2015-09-16 MED ORDER — METHYLPREDNISOLONE SODIUM SUCC 125 MG IJ SOLR
60.0000 mg | Freq: Once | INTRAMUSCULAR | Status: AC
  Administered 2015-09-16: 60 mg via INTRAVENOUS
  Filled 2015-09-16: qty 2

## 2015-09-16 MED ORDER — VANCOMYCIN HCL IN DEXTROSE 1-5 GM/200ML-% IV SOLN
1000.0000 mg | Freq: Two times a day (BID) | INTRAVENOUS | Status: DC
  Filled 2015-09-16: qty 200

## 2015-09-16 MED ORDER — INSULIN ASPART 100 UNIT/ML ~~LOC~~ SOLN
0.0000 [IU] | Freq: Three times a day (TID) | SUBCUTANEOUS | Status: DC
  Administered 2015-09-16: 2 [IU] via SUBCUTANEOUS
  Filled 2015-09-16: qty 2

## 2015-09-16 MED ORDER — PIPERACILLIN-TAZOBACTAM 3.375 G IVPB
3.3750 g | Freq: Three times a day (TID) | INTRAVENOUS | Status: DC
  Administered 2015-09-16: 3.375 g via INTRAVENOUS
  Filled 2015-09-16 (×3): qty 50

## 2015-09-16 MED ORDER — BUDESONIDE 0.25 MG/2ML IN SUSP
0.2500 mg | Freq: Four times a day (QID) | RESPIRATORY_TRACT | Status: DC
  Administered 2015-09-16 – 2015-09-17 (×3): 0.25 mg via RESPIRATORY_TRACT
  Filled 2015-09-16 (×4): qty 2

## 2015-09-16 MED ORDER — INSULIN ASPART 100 UNIT/ML ~~LOC~~ SOLN: 0.0000 [IU] | Freq: Every day | SUBCUTANEOUS | Status: DC

## 2015-09-16 MED ORDER — INSULIN ASPART 100 UNIT/ML ~~LOC~~ SOLN
4.0000 [IU] | Freq: Three times a day (TID) | SUBCUTANEOUS | Status: DC
  Administered 2015-09-17: 4 [IU] via SUBCUTANEOUS
  Filled 2015-09-16: qty 4

## 2015-09-16 MED ORDER — VANCOMYCIN HCL IN DEXTROSE 750-5 MG/150ML-% IV SOLN
750.0000 mg | INTRAVENOUS | Status: DC
  Filled 2015-09-16: qty 150

## 2015-09-16 MED ORDER — PANTOPRAZOLE SODIUM 40 MG PO TBEC
40.0000 mg | DELAYED_RELEASE_TABLET | Freq: Every day | ORAL | Status: DC
  Administered 2015-09-16 – 2015-09-17 (×2): 40 mg via ORAL
  Filled 2015-09-16 (×2): qty 1

## 2015-09-16 NOTE — Progress Notes (Signed)
Received to room 147 pt ambulated to bathroom ,  Medicated for kllpain

## 2015-09-16 NOTE — Progress Notes (Signed)
A&Ox4. NSR-stach per cardiac monitor during shift. 94% on RA at this time. o2 tubing sent with patient to floor. VSS. Afebrile. Tolerating diet. No respiratory distress noted. Patient moved to room 147 by wheelchair with Stacy, orderly.

## 2015-09-16 NOTE — Care Management (Signed)
Patient presented to ED with fever and tachycardia with heart rate of 140. Admitted  to stepdown for closer monitoring.  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    He is currently on nasal cannula.  02 requirements are acute.  He is still experiencing increased work of breathing.

## 2015-09-16 NOTE — Progress Notes (Signed)
Enteric precautions discontinued, c-diff negative.

## 2015-09-16 NOTE — Progress Notes (Signed)
Report called to Brenda, RN on 1A 

## 2015-09-16 NOTE — Progress Notes (Signed)
No acute distress on initial evaluation this AM. Had episode of acute dyspnea later in morning - responded to nebulized albuterol. Presently NAD. No new complaints. Denies CP, cough, LE edema  Filed Vitals:   09/16/15 1216 09/16/15 1300 09/16/15 1400 09/16/15 1412  BP:  109/95 140/76   Pulse: 120 115 108   Temp:      TempSrc:      Resp: Height:      Weight:      SpO2: 97% 95% 96% 97%   NAD HEENT WNL No JVD Mildly diminished BS, few scattered wheezes Tachy, reg, no M NABS, soft No LE edema  BMP Latest Ref Rng 09/16/2015 09/15/2015  Glucose 65 - 99 mg/dL 161(W) 960(A)  BUN 6 - 20 mg/dL 12 10  Creatinine 5.40 - 1.24 mg/dL 9.81 1.91  Sodium 478 - 145 mmol/L 136 135  Potassium 3.5 - 5.1 mmol/L 3.8 3.9  Chloride 101 - 111 mmol/L 108 99(L)  CO2 22 - 32 mmol/L 24 27  Calcium 8.9 - 10.3 mg/dL 8.3(L) 9.0    CBC Latest Ref Rng 09/16/2015 09/15/2015  WBC 3.8 - 10.6 K/uL 6.9 10.7(H)  Hemoglobin 13.0 - 18.0 g/dL 12.5(L) 14.7  Hematocrit 40.0 - 52.0 % 37.6(L) 44.5  Platelets 150 - 440 K/uL 249 303    CXR: chronic appearing scattered opacities c/w scarring. No evidence of edema  IMPRESSION: Acute hypoxic respiratory failure History of sarcoidosis with recurrent flares Acute bronchospasm Doubt PNA  PLAN/REC: Add nebulized steroids Nebulized bronchodilator regimen adjusted Continue systemic steroids at current dose Simplify abx to ceftriaxone Should be able to transition to PO abx tomorrow or 02/22 and complete 5-7 days OK to transfer to med-surg floor. Order placed  Billy Fischer, MD PCCM service Mobile [phone] Pager [phone]

## 2015-09-16 NOTE — Clinical Documentation Improvement (Signed)
Internal Medicine      Please provide clinical indicators, historical, or baseline comparative data to support your documented diagnosis of "Acute Respiratory Failure"   If the diagnosis is no longer applicable, amend your documentation as appropriate.  Supporting Data:Resporations (28-48)  O2 sat 92-97 % on room air and  (92-96%) on  2L [place]    Please exercise your independent, professional judgment when responding. A specific answer is not anticipated or expected.   Thank You, Lavonda Jumbo Health Information Management [place] [phone]

## 2015-09-16 NOTE — Progress Notes (Signed)
Pharmacy Antibiotic Note  Miguel Nunez is a 55 y.o. male admitted on 09/15/2015 with sepsis.  Pharmacy has been consulted for Vancomycin and Zosyn  dosing.  Plan:  Patient received 1 dose of Vancomycin in the ED and 1 dose of Zosyn in the ED. Patient however was not continue on Vancomycin afterward. Will start Vancomycin 1 g IV x 1 stat and then will continue Vancomycin 1 g IV q12 hours. Will order Vancomycin trough level to be drawn prior to the 2100 dose on 2/21. Targeting trough level of 15-20 mcg/ml   Zosyn: Will start Zosyn 3.375 g IV q8 hours.    Height:  (172.7 cm) Weight: 146 lb 2.6 oz (66.3 kg) IBW/kg (Calculated) : 68.4  Temp (24hrs), Avg:99.5 F (37.5 C), Min:98.1 F (36.7 C), Max:103.2 F (39.6 C)   Recent Labs Lab 09/15/15 1200 09/15/15 1540 09/16/15 0317  WBC 10.7*  --  6.9  CREATININE 1.20  --  1.00  LATICACIDVEN 1.6 2.1*  --     Estimated Creatinine Clearance: 80.1 mL/min (by C-G formula based on Cr of 1).    No Known Allergies  Antimicrobials this admission: Vancomycin 2/19 >>  Zosyn 2/19>>   Dose adjustments this admission:   Microbiology results:  BCx: NGTD   UCx:    Sputum:   MRSA PCR: Negative   Thank you for allowing pharmacy to be a part of this patient's care.  Aqil Goetting D 09/16/2015 9:06 AM

## 2015-09-16 NOTE — Progress Notes (Signed)
Patient having trouble breathing, patient feels like his chest is very tight. Increased work of breathing with o2 sats 100% on 2L. RN gave morphine for pain in abdomen from soreness from coughing.  RN also made [facility] NP aware that patient is having trouble breathing and anxious.  Tukov NP at bedside at this time assessing patient.

## 2015-09-16 NOTE — Progress Notes (Signed)
Patient's work of breathing has improved, 97% o2 sats on 2L.  RR 24.  Patient reports he feels like his lungs have opened up and he is not as tight. Patient eating lunch at this time. Dr. Sung Amabile in ICU and aware that patient had trouble breathing and what was done.

## 2015-09-16 NOTE — Progress Notes (Signed)
elink MD Dr. Craige Cotta notified of patients c/o feeling really tight when breathing.  Informed him pt was receiving a breathing tx currently and instructed to give that about 15 minutes to work and then call back if no relief from breathing tx.

## 2015-09-16 NOTE — Progress Notes (Signed)
Initial Nutrition Assessment    INTERVENTION:   Meals and Snacks: Cater to patient preferences, recommend changing to Regular diet   NUTRITION DIAGNOSIS:   Reassess on follow-up  GOAL:   Patient will meet greater than or equal to 90% of their needs  MONITOR:    (Energy Intake, Anthropometrics, Digestive System, Electrolyte/Renal Profile)  REASON FOR ASSESSMENT:   Malnutrition Screening Tool    ASSESSMENT:    Pt admitted with acute respiratory failure with acute bronchospasm, hx of sarcoidosis with recurrent flares  Past Medical History  Diagnosis Date  . Sarcoidosis (HCC)   . COPD (chronic obstructive pulmonary disease) (HCC)   . Asthma     Diet Order:  Diet heart healthy/carb modified Room service appropriate?: Yes; Fluid consistency:: Thin   Energy Intake: pt ate 80% at breakfast this AM, 50% at lunch; appetite good at present  Skin:  Reviewed, no issues  Last BM:  2/20, Cdiff negative  Meds: ss novolog, solumedrol  Height:   Ht Readings from Last 1 Encounters:  09/15/15  (1.727 m)    Weight:   Wt Readings from Last 1 Encounters:  09/15/15 146 lb 2.6 oz (66.3 kg)    Filed Weights   09/15/15 1153 09/15/15 1506  Weight: 145 lb (65.772 kg) 146 lb 2.6 oz (66.3 kg)    BMI:  Body mass index is 22.23 kg/(m^2).  LOW Care Level  Romelle Starcher MS, [place], LDN [phone] Pager  [phone] Weekend/On-Call Pager

## 2015-09-17 LAB — GLUCOSE, CAPILLARY: Glucose-Capillary: 107 mg/dL — ABNORMAL HIGH (ref 65–99)

## 2015-09-17 LAB — URINE CULTURE: Culture: NO GROWTH

## 2015-09-17 LAB — MYCOPLASMA PNEUMONIAE ANTIBODY, IGM: Mycoplasma pneumo IgM: <770 U/mL (ref 0–769)

## 2015-09-17 MED ORDER — PREDNISONE 10 MG (21) PO TBPK: ORAL_TABLET | ORAL | Status: AC

## 2015-09-17 MED ORDER — CEFUROXIME AXETIL 250 MG PO TABS: 250.0000 mg | ORAL_TABLET | Freq: Two times a day (BID) | ORAL | Status: AC

## 2015-09-17 MED ORDER — HYDROCODONE-ACETAMINOPHEN 5-325 MG PO TABS: 1.0000 | ORAL_TABLET | Freq: Four times a day (QID) | ORAL | Status: AC | PRN

## 2015-09-17 MED ORDER — BUDESONIDE 0.25 MG/2ML IN SUSP
0.2500 mg | Freq: Four times a day (QID) | RESPIRATORY_TRACT | Status: DC
  Administered 2015-09-17: 0.25 mg via RESPIRATORY_TRACT
  Filled 2015-09-17: qty 2

## 2015-09-17 MED ORDER — GUAIFENESIN 100 MG/5ML PO SOLN: 10.0000 mL | ORAL | Status: AC | PRN

## 2015-09-17 MED ORDER — BUDESONIDE 0.25 MG/2ML IN SUSP: 0.2500 mg | Freq: Four times a day (QID) | RESPIRATORY_TRACT | Status: AC

## 2015-09-17 MED ORDER — AZITHROMYCIN 250 MG PO TABS: 250.0000 mg | ORAL_TABLET | Freq: Every day | ORAL | Status: AC

## 2015-09-17 NOTE — Discharge Summary (Signed)
[facility] - [place] at [facility]   PATIENT NAME: Miguel Nunez    MR#:  [id number]  DATE OF BIRTH:  [birth year]  DATE OF ADMISSION:  09/15/2015 ADMITTING PHYSICIAN: Enedina Finner, MD  DATE OF DISCHARGE:09/17/2015  PRIMARY CARE PHYSICIAN: Pcp Not In System    ADMISSION DIAGNOSIS:  diff breathing fever  DISCHARGE DIAGNOSIS:  Active Problems:   Sepsis (HCC)   Pneumonia, COPD exacerbation.  SECONDARY DIAGNOSIS:   Past Medical History  Diagnosis Date  . Sarcoidosis (HCC)   . COPD (chronic obstructive pulmonary disease) (HCC)   . Asthma     HOSPITAL COURSE:   1. Sepsis and acute hypoxic respi failure secondary to multifocal pneumonia bilateral, community-acquired Admitted to ICU IV Rocephin and Zithromax Follow blood cultures sputum culture though patient is not able to give sample Pulmonary consultation Aggressive IV hydration Oral inhalers, when necessary nebs, Spiriva  Now stable, so will transfer to floor. Off to room air now. Feels much better.  2. Bilateral pneumonia Treatment as above  3. History of COPD/asthma with ongoing tobacco abuse Patient is not wheezing at present his sats are stable on 2 L nasal canal oxygen Received a dose of IV Solu-Medrol in the ER Not much wheezing now, cont oral prednisone.  4. History of sarcoidosis Patient has been off maintenance steroids since November 2016 Hold off on stress doses of steroid  5. DVT prophylaxis subcutaneous Lovenox   6. Tobacco abuse smoking cessation advised 3 minutes spent. Patient voiced understanding   DISCHARGE CONDITIONS:   Stable.  CONSULTS OBTAINED:  Treatment Team:  Shane Crutch, MD  DRUG ALLERGIES:  No Known Allergies  DISCHARGE MEDICATIONS:   Current Discharge Medication List    START taking these medications   Details  azithromycin (ZITHROMAX) 250 MG tablet Take 1 tablet (250 mg total) by mouth daily. Qty: 5 each, Refills: 0    budesonide  (PULMICORT) 0.25 MG/2ML nebulizer solution Take 2 mLs (0.25 mg total) by nebulization every 6 (six) hours. Qty: 60 mL, Refills: 12    cefUROXime (CEFTIN) 250 MG tablet Take 1 tablet (250 mg total) by mouth 2 (two) times daily with a meal. Qty: 10 tablet, Refills: 0    guaiFENesin (ROBITUSSIN) 100 MG/5ML SOLN Take 10 mLs (200 mg total) by mouth every 4 (four) hours as needed for cough or to loosen phlegm. Qty: 1200 mL, Refills: 0    predniSONE (STERAPRED UNI-PAK 21 TAB) 10 MG (21) TBPK tablet Take 6 tabs first day, 5 tab on day 2, then 4 on day 3rd, 3 tabs on day 4th , 2 tab on day 5th, and 1 tab on 6th day. Qty: 21 tablet, Refills: 0      CONTINUE these medications which have NOT CHANGED   Details  albuterol (PROVENTIL HFA;VENTOLIN HFA) 108 (90 Base) MCG/ACT inhaler Inhale 2 puffs into the lungs every 6 (six) hours as needed for wheezing or shortness of breath.    albuterol (PROVENTIL) (2.5 MG/3ML) 0.083% nebulizer solution Take 2.5 mg by nebulization every 6 (six) hours as needed for wheezing or shortness of breath.    Ipratropium-Albuterol (COMBIVENT) 20-100 MCG/ACT AERS respimat Inhale 1 puff into the lungs 4 (four) times daily.    Tiotropium Bromide-Olodaterol (STIOLTO RESPIMAT) 2.5-2.5 MCG/ACT AERS Inhale 1 puff into the lungs daily.         DISCHARGE INSTRUCTIONS:    Follow with PMD in 2 weeks.  If you experience worsening of your admission symptoms, develop shortness of breath, life threatening  emergency, suicidal or homicidal thoughts you must seek medical attention immediately by calling 911 or calling your MD immediately  if symptoms less severe.  You Must read complete instructions/literature along with all the possible adverse reactions/side effects for all the Medicines you take and that have been prescribed to you. Take any new Medicines after you have completely understood and accept all the possible adverse reactions/side effects.   Please note  You were cared  for by a hospitalist during your hospital stay. If you have any questions about your discharge medications or the care you received while you were in the hospital after you are discharged, you can call the unit and asked to speak with the hospitalist on call if the hospitalist that took care of you is not available. Once you are discharged, your primary care physician will handle any further medical issues. Please note that NO REFILLS for any discharge medications will be authorized once you are discharged, as it is imperative that you return to your primary care physician (or establish a relationship with a primary care physician if you do not have one) for your aftercare needs so that they can reassess your need for medications and monitor your lab values.    Today   CHIEF COMPLAINT:   Chief Complaint  Patient presents with  . Code Sepsis    HISTORY OF PRESENT ILLNESS:  Jaymon Dudek  is a 55 y.o. male with a known history of sarcoidosis, asthma/COPD with ongoing tobacco abuse comes to the emergency room with increasing shortness of breath chest tightness and cough which is dry. 3 days. Patient was in contact with his nephew who was sick with cold and cough. Came in with fever of 103 tachycardia with heart rate in the 140s an elevated white count of 10,000. Patient's chest x-ray shows multifocal pneumonia bilateral. He received IV vancomycin and Zosyn in the emergency room. Patient is being admitted with sepsis secondary to multifocal bilateral pneumonia.  Patient reports he has been off his maintenance steroids since November 2016. His medication includes only oral inhalers.   VITAL SIGNS:  Blood pressure 119/74, pulse 80, temperature 98.1 F (36.7 C), temperature source Oral, resp. rate 18, height 5\' 8"  (1.727 m), weight 66.3 kg (146 lb 2.6 oz), SpO2 97 %.  I/O:   Intake/Output Summary (Last 24 hours) at 09/17/15 0904 Last data filed at 09/17/15 0600  Gross per 24 hour  Intake  1271.17 ml  Output    550 ml  Net 721.17 ml    PHYSICAL EXAMINATION:   GENERAL: 55 y.o.-year-old patient lying in the bed with acute distress.  EYES: Pupils equal, round, reactive to light and accommodation. No scleral icterus. Extraocular muscles intact.  HEENT: Head atraumatic, normocephalic. Oropharynx and nasopharynx clear.  NECK: Supple, no jugular venous distention. No thyroid enlargement, no tenderness.  LUNGS: Distant breath sounds bilaterally, no wheezing, rales,rhonchi or crepitation. No use of accessory muscles of respiration.  CARDIOVASCULAR: S1, S2 normal. No murmurs, rubs, or gallops. Tachycardia ABDOMEN: Soft, nontender, nondistended. Bowel sounds present. No organomegaly or mass.  EXTREMITIES: No pedal edema, cyanosis, or clubbing.  NEUROLOGIC: Cranial nerves II through XII are intact. Muscle strength 5/5 in all extremities. Sensation intact. Gait not checked.  PSYCHIATRIC: The patient is alert and oriented x 3.  SKIN: No obvious rash, lesion, or ulcer.   DATA REVIEW:   CBC  Recent Labs Lab 09/16/15 0317  WBC 6.9  HGB 12.5*  HCT 37.6*  PLT 249    Chemistries  Recent Labs Lab 09/15/15 1200 09/16/15 0317  NA 135 136  K 3.9 3.8  CL 99* 108  CO2 27 24  GLUCOSE 109* 134*  BUN 10 12  CREATININE 1.20 1.00  CALCIUM 9.0 8.3*  AST 31  --   ALT 17  --   ALKPHOS 88  --   BILITOT 0.3  --     Cardiac Enzymes  Recent Labs Lab 09/15/15 1200  TROPONINI 0.03    Microbiology Results  Results for orders placed or performed during the hospital encounter of 09/15/15  Urine culture     Status: None (Preliminary result)   Collection Time: 09/15/15 11:57 AM  Result Value Ref Range Status   Specimen Description URINE, RANDOM  Final   Special Requests NONE  Final   Culture NO GROWTH < 24 HOURS  Final   Report Status PENDING  Incomplete  Blood Culture (routine x 2)     Status: None (Preliminary result)   Collection Time: 09/15/15 12:00 PM   Result Value Ref Range Status   Specimen Description BLOOD RIGHT ANTECUBITAL  Final   Special Requests BOTTLES DRAWN AEROBIC AND ANAEROBIC  1CC  Final   Culture NO GROWTH 2 DAYS  Final   Report Status PENDING  Incomplete  Blood Culture (routine x 2)     Status: None (Preliminary result)   Collection Time: 09/15/15 12:00 PM  Result Value Ref Range Status   Specimen Description BLOOD LEFT ANTECUBITAL  Final   Special Requests BOTTLES DRAWN AEROBIC AND ANAEROBIC  1CC  Final   Culture NO GROWTH 2 DAYS  Final   Report Status PENDING  Incomplete  Rapid Influenza A&B Antigens ([facility] only)     Status: None   Collection Time: 09/15/15 12:00 PM  Result Value Ref Range Status   Influenza A ([facility]) NEGATIVE  Final   Influenza B ([facility]) NEGATIVE  Final  MRSA PCR Screening     Status: None   Collection Time: 09/15/15  3:18 PM  Result Value Ref Range Status   MRSA by PCR NEGATIVE NEGATIVE Final    Comment:        The GeneXpert MRSA Assay (FDA approved for NASAL specimens only), is one component of a comprehensive MRSA colonization surveillance program. It is not intended to diagnose MRSA infection nor to guide or monitor treatment for MRSA infections.   C difficile quick scan w PCR reflex     Status: None   Collection Time: 09/16/15  6:22 AM  Result Value Ref Range Status   C Diff antigen NEGATIVE NEGATIVE Final   C Diff toxin NEGATIVE NEGATIVE Final   C Diff interpretation Negative for C. difficile  Final    RADIOLOGY:  Ct Chest Wo Contrast  09/15/2015  CLINICAL DATA:  Acute respiratory failure due to pneumonia. COPD exacerbation. History of sarcoidosis. EXAM: CT CHEST WITHOUT CONTRAST TECHNIQUE: Multidetector CT imaging of the chest was performed following the standard protocol without IV contrast. COMPARISON:  Portable chest same date. FINDINGS: Mediastinum/Nodes: There are multiple calcified mediastinal and hilar lymph nodes bilaterally. Within the limitations of noncontrast technique,  no enlarged mediastinal, hilar or axillary lymph nodes are seen. The thyroid gland, trachea and esophagus demonstrate no significant findings. The heart size is normal. There is no pericardial effusion. There are faint coronary artery calcifications. Lungs/Pleura: There is no pleural effusion. There is perihilar distortion with asymmetric bronchiectasis on the right. There are several areas of pulmonary spiculation bilaterally, greatest within the right upper lobe, but also  present in both lower lobes and the lingula. Based on the multiplicity and lack of a dominant lesion, these are favored to reflect postinflammatory scarring. There is no dominant mass, confluent airspace opacity or endobronchial lesion. Mild underlying emphysematous changes are present with mild paraseptal components at both apices. Upper abdomen: The liver demonstrates mildly decreased density consistent with steatosis. No focal abnormality identified. The visualized adrenal glands appear normal. Musculoskeletal/Chest wall: There is no chest wall mass or suspicious osseous finding. IMPRESSION: 1. No definite acute chest findings.  No evidence of pneumonia. 2. Multiple calcified mediastinal and hilar lymph nodes with areas of architectural distortion and pulmonary spiculation bilaterally consistent with postinflammatory scarring from granulomatous infection or sarcoidosis. No dominant lung mass identified. Given risk factors for bronchogenic carcinoma, follow-up chest CT at 6 - 12 months is recommended. This recommendation follows the consensus statement: Guidelines for Management of Small Pulmonary Nodules Detected on CT Scans: A Statement from the Fleischner Society as published in Radiology 2005;237:395-400. Electronically Signed   By: Carey Bullocks M.D.   On: 09/15/2015 18:23   Dg Chest Port 1 View  09/16/2015  CLINICAL DATA:  55 year old male with acute respiratory failure. History of subcu Duexis, asthma, and COPD. EXAM: PORTABLE CHEST  1 VIEW COMPARISON:  CT dated 09/15/2015 FINDINGS: Single portable view of the chest demonstrates mild emphysematous changes of the lungs. Areas of scarring noted in the right upper lobe as seen on the prior CT. There is no focal consolidation, pleural effusion, or pneumothorax. Small calcified mediastinal lymph nodes. There is no cardiomegaly. No pleural effusion or pneumothorax. No acute osseous pathology. IMPRESSION: No acute cardiopulmonary process. Electronically Signed   By: Elgie Collard M.D.   On: 09/16/2015 13:11   Dg Chest Port 1 View  09/15/2015  CLINICAL DATA:  Fever EXAM: PORTABLE CHEST 1 VIEW COMPARISON:  None. FINDINGS: Patchy opacities are seen throughout the right lung. Left lung is clear. Normal heart size. Tortuous aorta. IMPRESSION: Patchy opacities are seen throughout the right lung worrisome for bronchopneumonia. Followup PA and lateral chest X-ray is recommended in 3-4 weeks following trial of antibiotic therapy to ensure resolution and exclude underlying malignancy. Electronically Signed   By: Jolaine Click M.D.   On: 09/15/2015 12:51     Management plans discussed with the patient, family and they are in agreement.  CODE STATUS:     Code Status Orders        Start     Ordered   09/15/15 1508  Full code   Continuous     09/15/15 1507    Code Status History    Date Active Date Inactive Code Status Order ID Comments User Context   This patient has a current code status but no historical code status.      TOTAL TIME TAKING CARE OF THIS PATIENT: 35 minutes.    Altamese Dilling M.D on 09/17/2015 at 9:04 AM  Between 7am to 6pm - Pager - [phone]  After 6pm go to [email or web address] - password [email or web address]  [facility]  Office  [phone]  CC: Primary care physician; Pcp Not In System   Note: This dictation was prepared with Dragon dictation along with smaller phrase technology. Any transcriptional errors that result from this process are  unintentional.

## 2015-09-17 NOTE — Care Management Important Message (Signed)
Important Message  Patient Details  Name: Miguel Nunez MRN: [id number] Date of Birth: 1961-06-22   Medicare Important Message Given:  Yes    Olegario Messier A Nery Frappier 09/17/2015, 10:02 AM

## 2015-09-17 NOTE — Progress Notes (Signed)
[facility] - [place] at [facility]   PATIENT NAME: Miguel Nunez    MR#:  [id number]  DATE OF BIRTH:  1961-03-08  SUBJECTIVE:  CHIEF COMPLAINT:   Chief Complaint  Patient presents with  . Code Sepsis    Came with fever, Cough, have multi lobar pneumonia, Feels better now, BP and HR stable, still need oxygen.  REVIEW OF SYSTEMS:  CONSTITUTIONAL: No fever, fatigue or weakness.  EYES: No blurred or double vision.  EARS, NOSE, AND THROAT: No tinnitus or ear pain.  RESPIRATORY: positive cough, shortness of breath, no wheezing or hemoptysis.  CARDIOVASCULAR: No chest pain, orthopnea, edema.  GASTROINTESTINAL: No nausea, vomiting, diarrhea or abdominal pain.  GENITOURINARY: No dysuria, hematuria.  ENDOCRINE: No polyuria, nocturia,  HEMATOLOGY: No anemia, easy bruising or bleeding SKIN: No rash or lesion. MUSCULOSKELETAL: No joint pain or arthritis.   NEUROLOGIC: No tingling, numbness, weakness.  PSYCHIATRY: No anxiety or depression.   ROS  DRUG ALLERGIES:  No Known Allergies  VITALS:  Blood pressure 134/82, pulse 79, temperature 98.4 F (36.9 C), temperature source Oral, resp. rate 20, height  (1.727 m), weight 66.3 kg (146 lb 2.6 oz), SpO2 99 %.  PHYSICAL EXAMINATION:   GENERAL: 55 y.o.-year-old patient lying in the bed with acute distress.  EYES: Pupils equal, round, reactive to light and accommodation. No scleral icterus. Extraocular muscles intact.  HEENT: Head atraumatic, normocephalic. Oropharynx and nasopharynx clear.  NECK: Supple, no jugular venous distention. No thyroid enlargement, no tenderness.  LUNGS: Distant breath sounds bilaterally, no wheezing, rales,rhonchi or crepitation. No use of accessory muscles of respiration.  CARDIOVASCULAR: S1, S2 normal. No murmurs, rubs, or gallops. Tachycardia ABDOMEN: Soft, nontender, nondistended. Bowel sounds present. No organomegaly or mass.  EXTREMITIES: No pedal edema, cyanosis, or  clubbing.  NEUROLOGIC: Cranial nerves II through XII are intact. Muscle strength 5/5 in all extremities. Sensation intact. Gait not checked.  PSYCHIATRIC: The patient is alert and oriented x 3.  SKIN: No obvious rash, lesion, or ulcer.   Physical Exam LABORATORY PANEL:   CBC  Recent Labs Lab 09/16/15 0317  WBC 6.9  HGB 12.5*  HCT 37.6*  PLT 249   ------------------------------------------------------------------------------------------------------------------  Chemistries   Recent Labs Lab 09/15/15 1200 09/16/15 0317  NA 135 136  K 3.9 3.8  CL 99* 108  CO2 27 24  GLUCOSE 109* 134*  BUN 10 12  CREATININE 1.20 1.00  CALCIUM 9.0 8.3*  AST 31  --   ALT 17  --   ALKPHOS 88  --   BILITOT 0.3  --    ------------------------------------------------------------------------------------------------------------------  Cardiac Enzymes  Recent Labs Lab 09/15/15 1200  TROPONINI 0.03   ------------------------------------------------------------------------------------------------------------------  RADIOLOGY:  Ct Chest Wo Contrast  09/15/2015  CLINICAL DATA:  Acute respiratory failure due to pneumonia. COPD exacerbation. History of sarcoidosis. EXAM: CT CHEST WITHOUT CONTRAST TECHNIQUE: Multidetector CT imaging of the chest was performed following the standard protocol without IV contrast. COMPARISON:  Portable chest same date. FINDINGS: Mediastinum/Nodes: There are multiple calcified mediastinal and hilar lymph nodes bilaterally. Within the limitations of noncontrast technique, no enlarged mediastinal, hilar or axillary lymph nodes are seen. The thyroid gland, trachea and esophagus demonstrate no significant findings. The heart size is normal. There is no pericardial effusion. There are faint coronary artery calcifications. Lungs/Pleura: There is no pleural effusion. There is perihilar distortion with asymmetric bronchiectasis on the right. There are several areas of  pulmonary spiculation bilaterally, greatest within the right upper lobe, but also present  in both lower lobes and the lingula. Based on the multiplicity and lack of a dominant lesion, these are favored to reflect postinflammatory scarring. There is no dominant mass, confluent airspace opacity or endobronchial lesion. Mild underlying emphysematous changes are present with mild paraseptal components at both apices. Upper abdomen: The liver demonstrates mildly decreased density consistent with steatosis. No focal abnormality identified. The visualized adrenal glands appear normal. Musculoskeletal/Chest wall: There is no chest wall mass or suspicious osseous finding. IMPRESSION: 1. No definite acute chest findings.  No evidence of pneumonia. 2. Multiple calcified mediastinal and hilar lymph nodes with areas of architectural distortion and pulmonary spiculation bilaterally consistent with postinflammatory scarring from granulomatous infection or sarcoidosis. No dominant lung mass identified. Given risk factors for bronchogenic carcinoma, follow-up chest CT at 6 - 12 months is recommended. This recommendation follows the consensus statement: Guidelines for Management of Small Pulmonary Nodules Detected on CT Scans: A Statement from the Fleischner Society as published in Radiology 2005;237:395-400. Electronically Signed   By: Carey Bullocks M.D.   On: 09/15/2015 18:23   Dg Chest Port 1 View  09/16/2015  CLINICAL DATA:  55 year old male with acute respiratory failure. History of subcu Duexis, asthma, and COPD. EXAM: PORTABLE CHEST 1 VIEW COMPARISON:  CT dated 09/15/2015 FINDINGS: Single portable view of the chest demonstrates mild emphysematous changes of the lungs. Areas of scarring noted in the right upper lobe as seen on the prior CT. There is no focal consolidation, pleural effusion, or pneumothorax. Small calcified mediastinal lymph nodes. There is no cardiomegaly. No pleural effusion or pneumothorax. No acute  osseous pathology. IMPRESSION: No acute cardiopulmonary process. Electronically Signed   By: Elgie Collard M.D.   On: 09/16/2015 13:11   Dg Chest Port 1 View  09/15/2015  CLINICAL DATA:  Fever EXAM: PORTABLE CHEST 1 VIEW COMPARISON:  None. FINDINGS: Patchy opacities are seen throughout the right lung. Left lung is clear. Normal heart size. Tortuous aorta. IMPRESSION: Patchy opacities are seen throughout the right lung worrisome for bronchopneumonia. Followup PA and lateral chest X-ray is recommended in 3-4 weeks following trial of antibiotic therapy to ensure resolution and exclude underlying malignancy. Electronically Signed   By: Jolaine Click M.D.   On: 09/15/2015 12:51    ASSESSMENT AND PLAN:   Active Problems:   Sepsis (HCC)   1. Sepsis and acute hypoxic respi failure secondary to multifocal pneumonia bilateral, community-acquired Admitted to ICU IV Rocephin and Zithromax Follow blood cultures sputum culture though patient is not able to give sample Pulmonary consultation Aggressive IV hydration Oral inhalers, when necessary nebs, Spiriva  Now stable, so will transfer to floor.   Still need supplemental oxygen, cont.  2. Bilateral pneumonia Treatment as above  3. History of COPD/asthma with ongoing tobacco abuse Patient is not wheezing at present his sats are stable on 2 L nasal canal oxygen Received a dose of IV Solu-Medrol in the ER Not much wheezing now, cont oral prednisone.  4. History of sarcoidosis Patient has been off maintenance steroids since November 2016 Hold off on stress doses of steroid  5. DVT prophylaxis subcutaneous Lovenox   6. Tobacco abuse smoking cessation advised 3 minutes spent. Patient voiced understanding   All the records are reviewed and case discussed with Care Management/Social Workerr. Management plans discussed with the patient, family and they are in agreement.  CODE STATUS: Full  TOTAL TIME TAKING CARE OF THIS PATIENT: 35 minutes.      POSSIBLE D/C IN 2-3 DAYS, DEPENDING ON CLINICAL  CONDITION.   Altamese Dilling M.D on 09/17/2015   Between 7am to 6pm - Pager - [phone]  After 6pm go to [email or web address] - password [email or web address]  [facility]  Office  [phone]  CC: Primary care physician; Pcp Not In System  Note: This dictation was prepared with Dragon dictation along with smaller phrase technology. Any transcriptional errors that result from this process are unintentional.

## 2015-09-17 NOTE — Progress Notes (Signed)
Discharge instructions/prescriptions given; acknowledged understanding. 

## 2015-09-20 LAB — CULTURE, BLOOD (ROUTINE X 2)
Culture: NO GROWTH
Culture: NO GROWTH

## 2016-08-22 IMAGING — CT CT CHEST W/O CM
2 of 3 series · 16 of 46 positions shown, 18 images · non-contrast
Comparison: Portable chest same date.

CLINICAL DATA: Acute respiratory failure due to pneumonia. COPD
exacerbation. History of sarcoidosis.

EXAM:
CT CHEST WITHOUT CONTRAST
TECHNIQUE: Multidetector CT imaging of the chest was performed following the
standard protocol without IV contrast.

[Series 2: routine chest wo · axial · 0.63mm/px · z∈[-317,-37]mm · 13 of 66 slices shown, 15 images]
[im 5/66  soft-tissue]
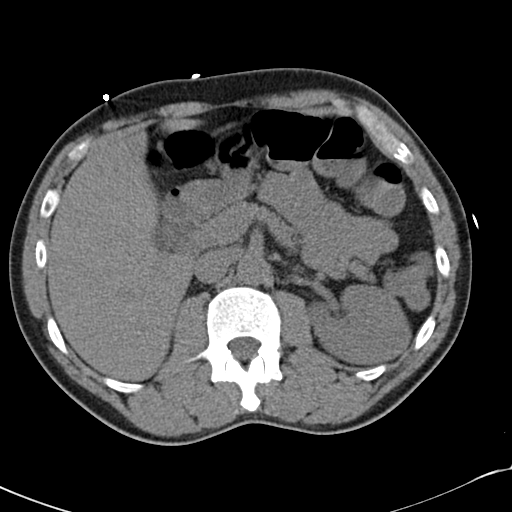
[im 5/66  bone]
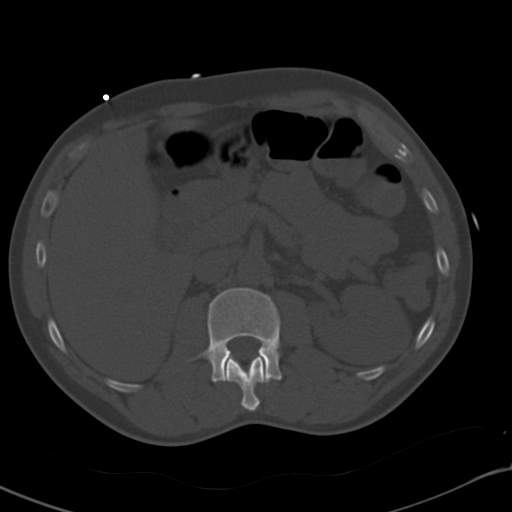
[im 9/66  soft-tissue]
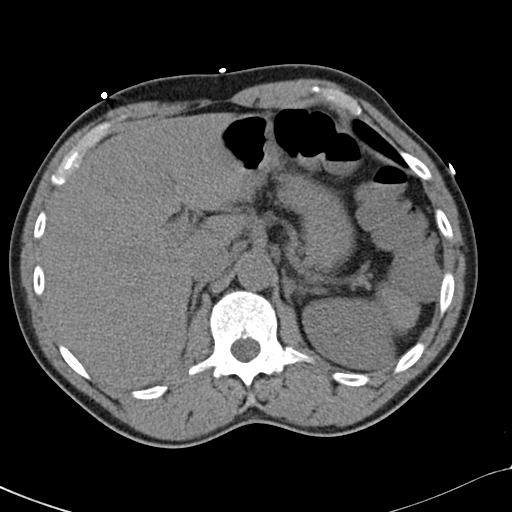
[im 13/66  soft-tissue]
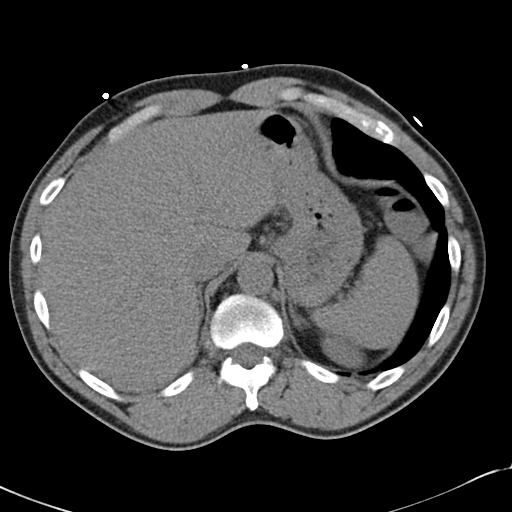
[im 19/66  soft-tissue]
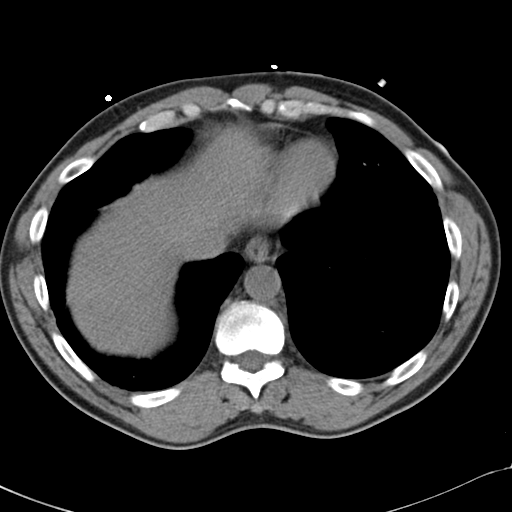
[im 24/66  soft-tissue]
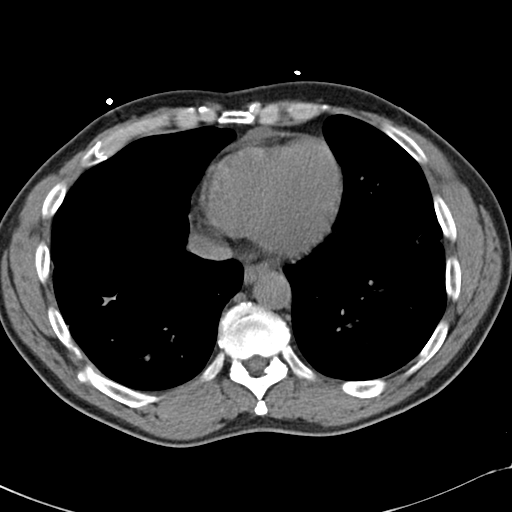
[im 28/66  soft-tissue]
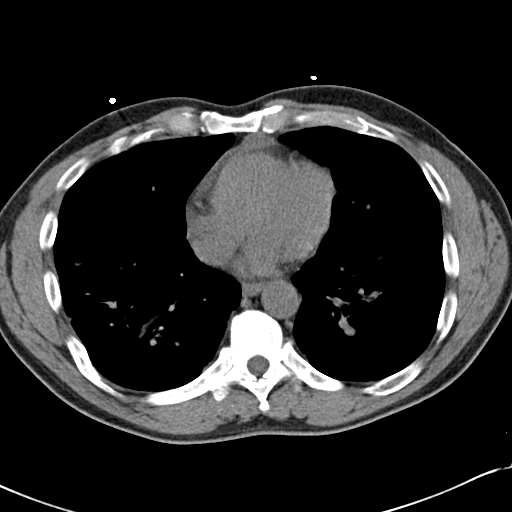
[im 34/66  soft-tissue]
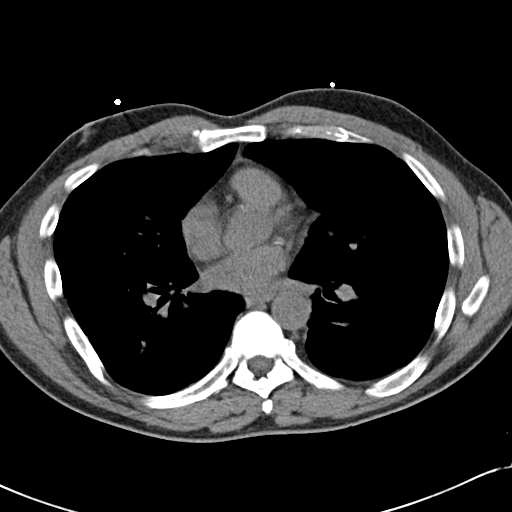
[im 38/66  soft-tissue]
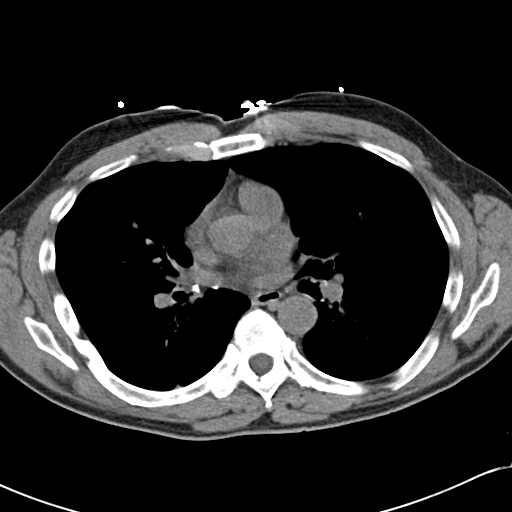
[im 42/66  soft-tissue]
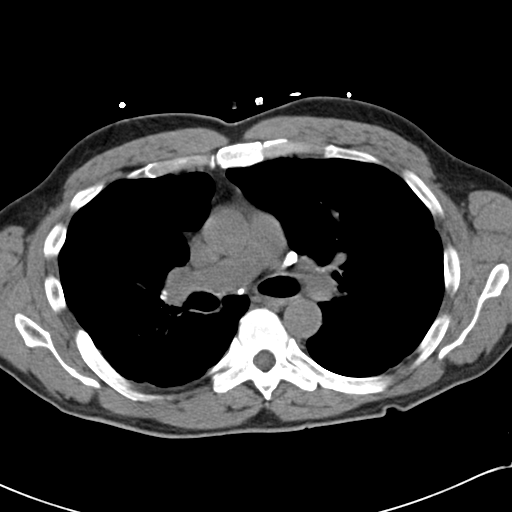
[im 42/66  bone]
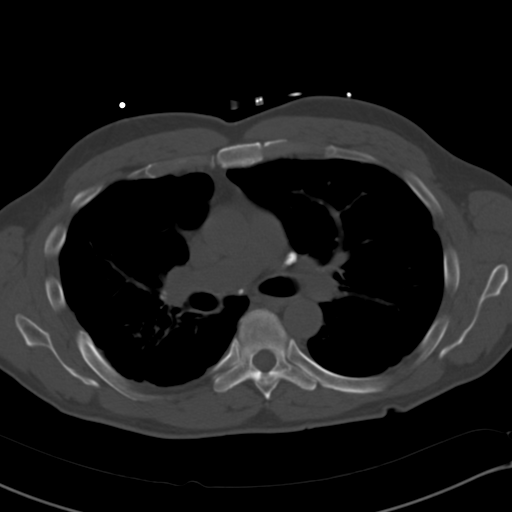
[im 47/66  soft-tissue]
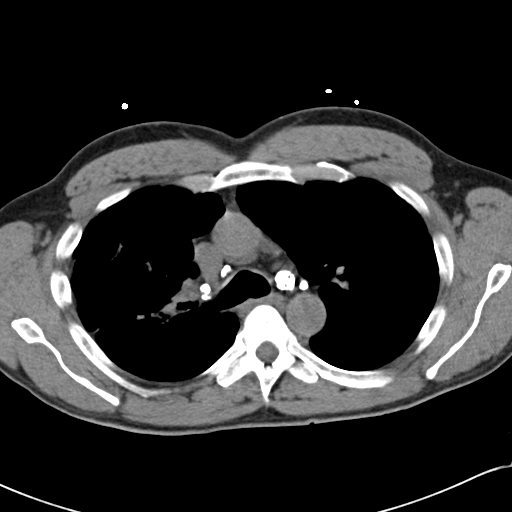
[im 53/66  soft-tissue]
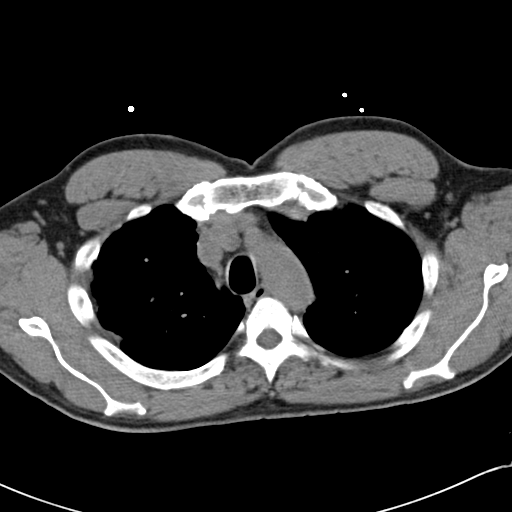
[im 57/66  soft-tissue]
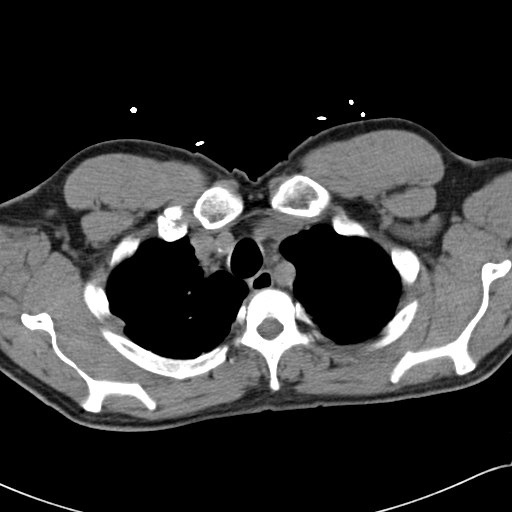
[im 61/66  soft-tissue]
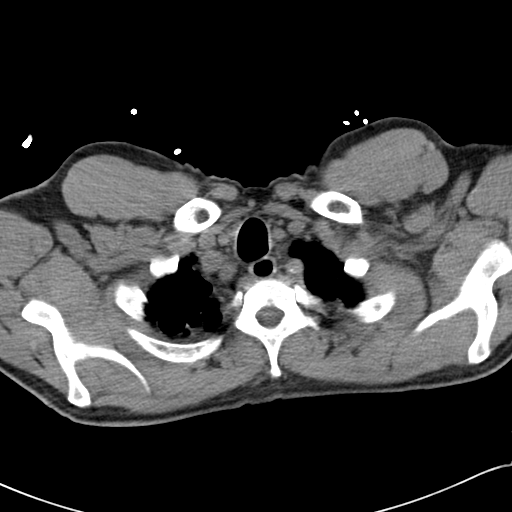

[Series 5: routine chest wo cor · coronal · 0.67mm/px · 3 of 107 slices shown]
[im 36/107  soft-tissue]
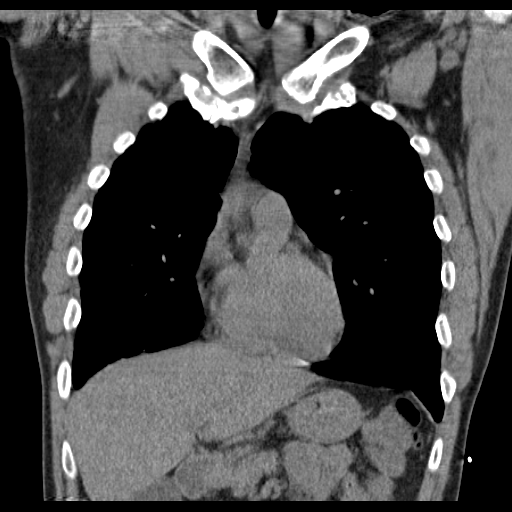
[im 48/107  soft-tissue]
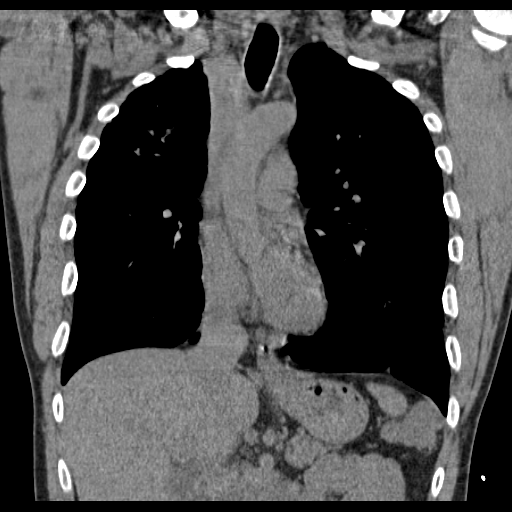
[im 59/107  soft-tissue]
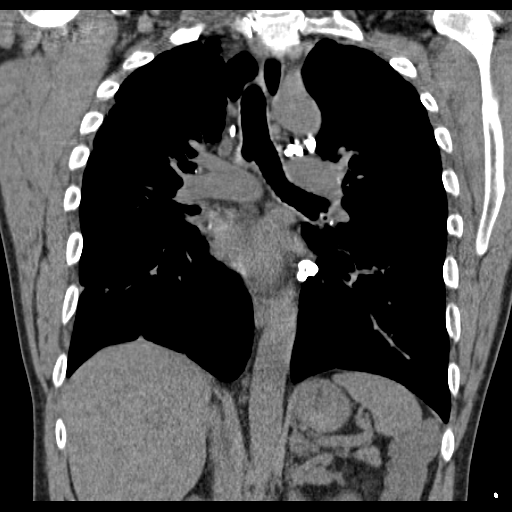

[16 of 46 positions shown; findings below may reference images not displayed]

FINDINGS: Mediastinum/Nodes: There are multiple calcified mediastinal and
hilar lymph nodes bilaterally. Within the limitations of noncontrast
technique, no enlarged mediastinal, hilar or axillary lymph nodes
are seen. The thyroid gland, trachea and esophagus demonstrate no
significant findings. The heart size is normal. There is no
pericardial effusion. There are faint coronary artery
calcifications.

Lungs/Pleura: There is no pleural effusion. There is perihilar
distortion with asymmetric bronchiectasis on the right. There are
several areas of pulmonary spiculation bilaterally, greatest within
the right upper lobe, but also present in both lower lobes and the
lingula. Based on the multiplicity and lack of a dominant lesion,
these are favored to reflect postinflammatory scarring. There is no
dominant mass, confluent airspace opacity or endobronchial lesion.
Mild underlying emphysematous changes are present with mild
paraseptal components at both apices.

Upper abdomen: The liver demonstrates mildly decreased density
consistent with steatosis. No focal abnormality identified. The
visualized adrenal glands appear normal.

Musculoskeletal/Chest wall: There is no chest wall mass or
suspicious osseous finding.
IMPRESSION: 1. No definite acute chest findings.  No evidence of pneumonia.
2. Multiple calcified mediastinal and hilar lymph nodes with areas
of architectural distortion and pulmonary spiculation bilaterally
consistent with postinflammatory scarring from granulomatous
infection or sarcoidosis. No dominant lung mass identified. Given
risk factors for bronchogenic carcinoma, follow-up chest CT at 6 -
12 months is recommended. This recommendation follows the consensus
statement: Guidelines for Management of Small Pulmonary Nodules
Detected on CT Scans: A Statement from the [HOSPITAL] as

## 2016-08-23 IMAGING — CR DG CHEST 1V PORT
1 series · 1 of 1 positions shown · non-contrast
Comparison: CT dated 09/15/2015

CLINICAL DATA: 54-year-old male with acute respiratory failure.
History of subcu Duexis, asthma, and COPD.

EXAM:
PORTABLE CHEST 1 VIEW

[portable]
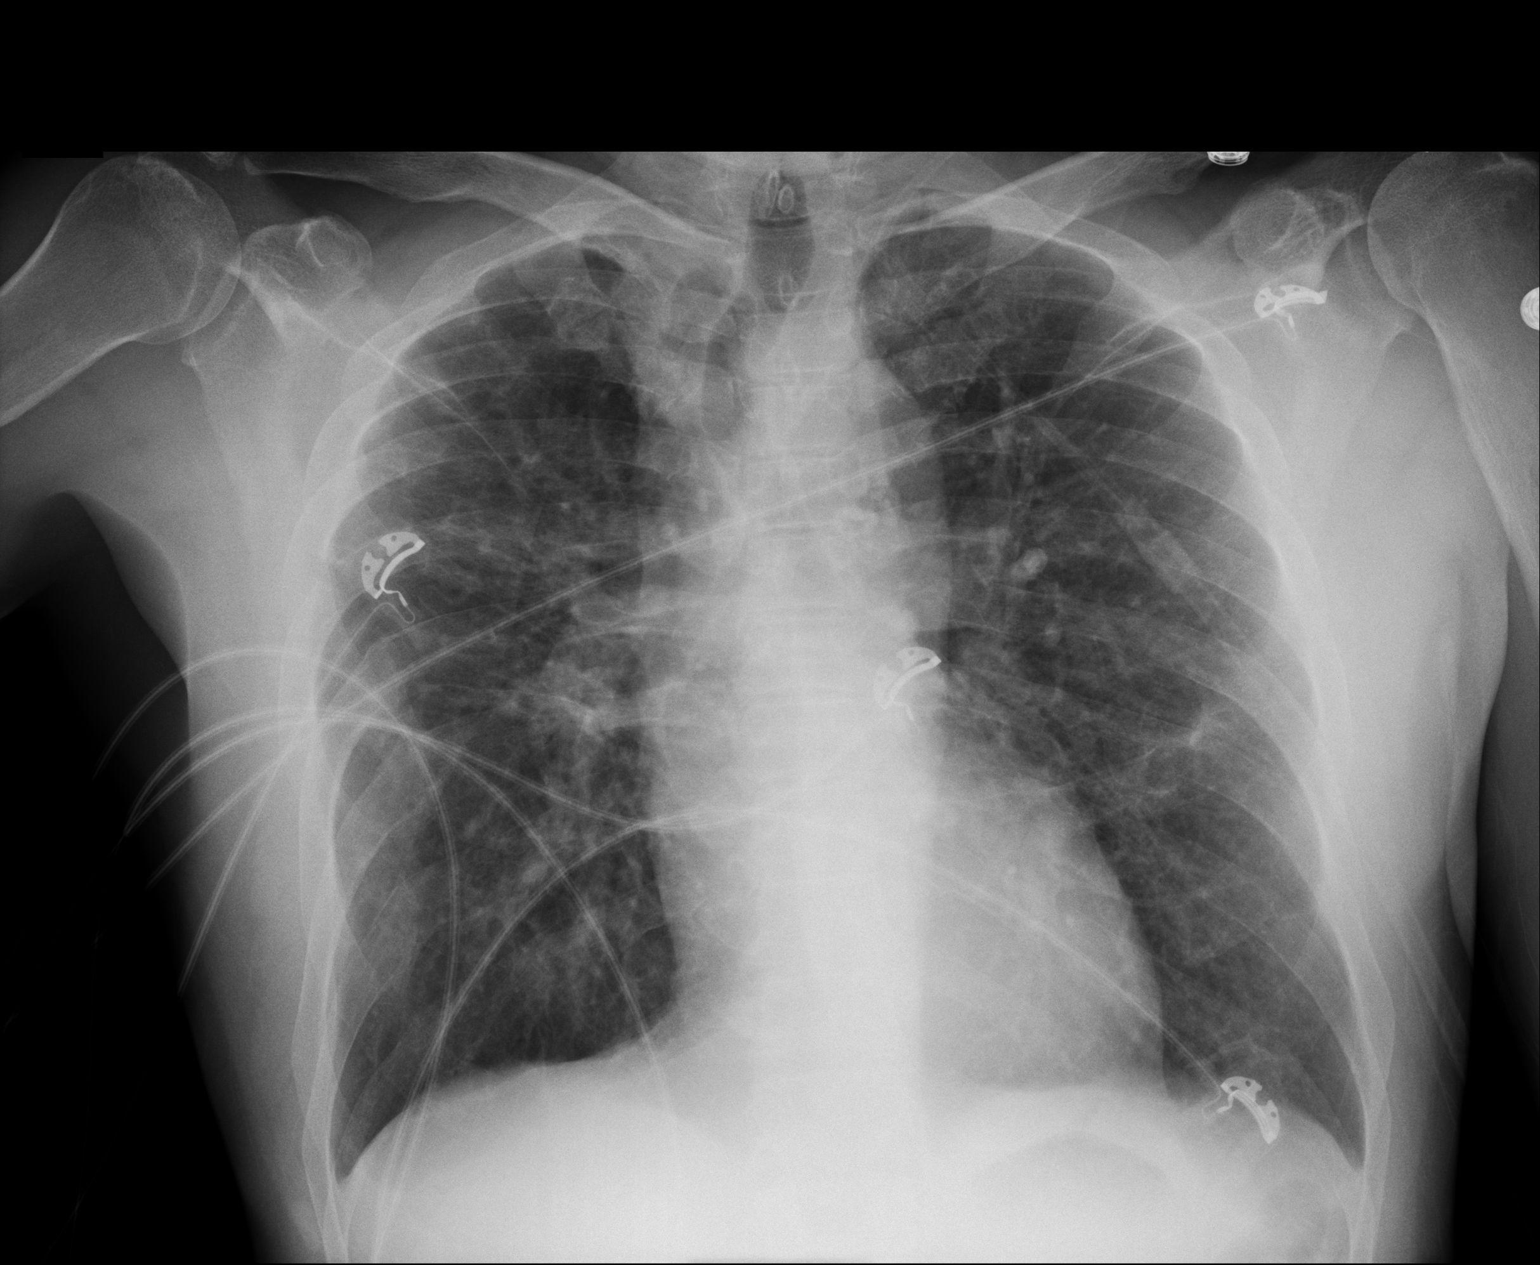

[1 of 1 positions shown; findings below may reference images not displayed]

FINDINGS: Single portable view of the chest demonstrates mild emphysematous
changes of the lungs. Areas of scarring noted in the right upper
lobe as seen on the prior CT. There is no focal consolidation,
pleural effusion, or pneumothorax. Small calcified mediastinal lymph
nodes. There is no cardiomegaly. No pleural effusion or
pneumothorax. No acute osseous pathology.
IMPRESSION: No acute cardiopulmonary process.

## 2019-01-25 DEATH — deceased
